# Patient Record
Sex: Female | Born: 1945
Health system: Southern US, Community
[De-identification: ages and names within clinical notes are randomized; demographics above are authoritative.]

## PROBLEM LIST (undated history)

## (undated) DIAGNOSIS — Z973 Presence of spectacles and contact lenses: Secondary | ICD-10-CM

## (undated) DIAGNOSIS — K449 Diaphragmatic hernia without obstruction or gangrene: Secondary | ICD-10-CM

## (undated) DIAGNOSIS — C679 Malignant neoplasm of bladder, unspecified: Secondary | ICD-10-CM

## (undated) DIAGNOSIS — Z860100 Personal history of colon polyps, unspecified: Secondary | ICD-10-CM

## (undated) DIAGNOSIS — M8580 Other specified disorders of bone density and structure, unspecified site: Secondary | ICD-10-CM

## (undated) HISTORY — DX: Other specified disorders of bone density and structure, unspecified site: M85.80

## (undated) HISTORY — PX: TRANSURETHRAL RESECTION OF BLADDER TUMOR WITH MITOMYCIN-C: SHX6459

## (undated) HISTORY — PX: COLONOSCOPY: SHX174

## (undated) HISTORY — PX: UPPER GASTROINTESTINAL ENDOSCOPY: SHX188

---

## 1998-08-07 ENCOUNTER — Ambulatory Visit (HOSPITAL_COMMUNITY): Admission: RE | Admit: 1998-08-07 | Discharge: 1998-08-07 | Payer: Self-pay | Admitting: Urology

## 1998-10-02 ENCOUNTER — Encounter: Payer: Self-pay | Admitting: Specialist

## 1998-10-02 ENCOUNTER — Ambulatory Visit (HOSPITAL_COMMUNITY): Admission: RE | Admit: 1998-10-02 | Discharge: 1998-10-02 | Payer: Self-pay | Admitting: Specialist

## 1998-11-13 ENCOUNTER — Ambulatory Visit (HOSPITAL_COMMUNITY): Admission: RE | Admit: 1998-11-13 | Discharge: 1998-11-13 | Payer: Self-pay | Admitting: Urology

## 1998-11-13 ENCOUNTER — Encounter (INDEPENDENT_AMBULATORY_CARE_PROVIDER_SITE_OTHER): Payer: Self-pay | Admitting: Specialist

## 1999-05-07 ENCOUNTER — Ambulatory Visit (HOSPITAL_COMMUNITY): Admission: RE | Admit: 1999-05-07 | Discharge: 1999-05-07 | Payer: Self-pay | Admitting: Urology

## 1999-05-07 ENCOUNTER — Encounter (INDEPENDENT_AMBULATORY_CARE_PROVIDER_SITE_OTHER): Payer: Self-pay | Admitting: Specialist

## 1999-07-01 ENCOUNTER — Encounter: Admission: RE | Admit: 1999-07-01 | Discharge: 1999-07-01 | Payer: Self-pay | Admitting: Obstetrics and Gynecology

## 1999-07-01 ENCOUNTER — Encounter: Payer: Self-pay | Admitting: Obstetrics and Gynecology

## 1999-11-30 ENCOUNTER — Ambulatory Visit (HOSPITAL_COMMUNITY): Admission: RE | Admit: 1999-11-30 | Discharge: 1999-11-30 | Payer: Self-pay | Admitting: Urology

## 1999-11-30 ENCOUNTER — Encounter (INDEPENDENT_AMBULATORY_CARE_PROVIDER_SITE_OTHER): Payer: Self-pay | Admitting: Specialist

## 2000-04-04 ENCOUNTER — Ambulatory Visit (HOSPITAL_COMMUNITY): Admission: RE | Admit: 2000-04-04 | Discharge: 2000-04-04 | Payer: Self-pay | Admitting: Urology

## 2000-04-04 ENCOUNTER — Encounter (INDEPENDENT_AMBULATORY_CARE_PROVIDER_SITE_OTHER): Payer: Self-pay

## 2000-07-03 ENCOUNTER — Encounter: Admission: RE | Admit: 2000-07-03 | Discharge: 2000-07-03 | Payer: Self-pay | Admitting: Obstetrics and Gynecology

## 2000-07-03 ENCOUNTER — Encounter: Payer: Self-pay | Admitting: Obstetrics and Gynecology

## 2000-09-14 ENCOUNTER — Other Ambulatory Visit: Admission: RE | Admit: 2000-09-14 | Discharge: 2000-09-14 | Payer: Self-pay | Admitting: Internal Medicine

## 2000-09-14 ENCOUNTER — Encounter (INDEPENDENT_AMBULATORY_CARE_PROVIDER_SITE_OTHER): Payer: Self-pay | Admitting: Specialist

## 2000-10-10 ENCOUNTER — Ambulatory Visit (HOSPITAL_COMMUNITY): Admission: RE | Admit: 2000-10-10 | Discharge: 2000-10-10 | Payer: Self-pay | Admitting: Urology

## 2000-10-10 ENCOUNTER — Encounter (INDEPENDENT_AMBULATORY_CARE_PROVIDER_SITE_OTHER): Payer: Self-pay

## 2001-07-05 ENCOUNTER — Encounter: Admission: RE | Admit: 2001-07-05 | Discharge: 2001-07-05 | Payer: Self-pay | Admitting: Obstetrics and Gynecology

## 2001-07-05 ENCOUNTER — Encounter: Payer: Self-pay | Admitting: Obstetrics and Gynecology

## 2001-07-20 ENCOUNTER — Encounter (INDEPENDENT_AMBULATORY_CARE_PROVIDER_SITE_OTHER): Payer: Self-pay | Admitting: *Deleted

## 2001-07-20 ENCOUNTER — Ambulatory Visit (HOSPITAL_BASED_OUTPATIENT_CLINIC_OR_DEPARTMENT_OTHER): Admission: RE | Admit: 2001-07-20 | Discharge: 2001-07-20 | Payer: Self-pay | Admitting: Urology

## 2002-02-16 ENCOUNTER — Observation Stay (HOSPITAL_COMMUNITY): Admission: EM | Admit: 2002-02-16 | Discharge: 2002-02-17 | Payer: Self-pay

## 2002-07-09 ENCOUNTER — Encounter: Payer: Self-pay | Admitting: Obstetrics and Gynecology

## 2002-07-09 ENCOUNTER — Encounter: Admission: RE | Admit: 2002-07-09 | Discharge: 2002-07-09 | Payer: Self-pay | Admitting: Obstetrics and Gynecology

## 2003-07-08 ENCOUNTER — Encounter (INDEPENDENT_AMBULATORY_CARE_PROVIDER_SITE_OTHER): Payer: Self-pay | Admitting: Specialist

## 2003-07-08 ENCOUNTER — Ambulatory Visit (HOSPITAL_BASED_OUTPATIENT_CLINIC_OR_DEPARTMENT_OTHER): Admission: RE | Admit: 2003-07-08 | Discharge: 2003-07-08 | Payer: Self-pay | Admitting: Urology

## 2003-07-08 ENCOUNTER — Ambulatory Visit (HOSPITAL_COMMUNITY): Admission: RE | Admit: 2003-07-08 | Discharge: 2003-07-08 | Payer: Self-pay | Admitting: Urology

## 2003-07-15 ENCOUNTER — Encounter: Admission: RE | Admit: 2003-07-15 | Discharge: 2003-07-15 | Payer: Self-pay | Admitting: Obstetrics and Gynecology

## 2004-04-20 ENCOUNTER — Ambulatory Visit (HOSPITAL_COMMUNITY): Admission: RE | Admit: 2004-04-20 | Discharge: 2004-04-20 | Payer: Self-pay | Admitting: Urology

## 2004-04-20 ENCOUNTER — Ambulatory Visit (HOSPITAL_BASED_OUTPATIENT_CLINIC_OR_DEPARTMENT_OTHER): Admission: RE | Admit: 2004-04-20 | Discharge: 2004-04-20 | Payer: Self-pay | Admitting: Urology

## 2004-04-20 ENCOUNTER — Encounter (INDEPENDENT_AMBULATORY_CARE_PROVIDER_SITE_OTHER): Payer: Self-pay | Admitting: *Deleted

## 2004-07-16 ENCOUNTER — Encounter: Admission: RE | Admit: 2004-07-16 | Discharge: 2004-07-16 | Payer: Self-pay | Admitting: Obstetrics and Gynecology

## 2005-08-30 ENCOUNTER — Encounter: Admission: RE | Admit: 2005-08-30 | Discharge: 2005-08-30 | Payer: Self-pay | Admitting: Obstetrics and Gynecology

## 2005-11-09 ENCOUNTER — Ambulatory Visit: Payer: Self-pay

## 2005-11-09 ENCOUNTER — Ambulatory Visit: Payer: Self-pay | Admitting: Cardiology

## 2005-11-10 ENCOUNTER — Ambulatory Visit: Payer: Self-pay | Admitting: Cardiology

## 2005-11-14 ENCOUNTER — Ambulatory Visit: Payer: Self-pay | Admitting: Cardiology

## 2006-09-01 ENCOUNTER — Encounter: Admission: RE | Admit: 2006-09-01 | Discharge: 2006-09-01 | Payer: Self-pay | Admitting: Obstetrics and Gynecology

## 2007-09-13 ENCOUNTER — Encounter: Admission: RE | Admit: 2007-09-13 | Discharge: 2007-09-13 | Payer: Self-pay | Admitting: Obstetrics and Gynecology

## 2008-09-15 ENCOUNTER — Encounter: Admission: RE | Admit: 2008-09-15 | Discharge: 2008-09-15 | Payer: Self-pay | Admitting: Obstetrics and Gynecology

## 2009-09-17 ENCOUNTER — Encounter: Admission: RE | Admit: 2009-09-17 | Discharge: 2009-09-17 | Payer: Self-pay | Admitting: Obstetrics and Gynecology

## 2010-01-01 ENCOUNTER — Encounter: Admission: RE | Admit: 2010-01-01 | Discharge: 2010-01-01 | Payer: Self-pay | Admitting: Internal Medicine

## 2010-07-23 NOTE — Discharge Summary (Signed)
   NAME:  Donna [name], Donna [name]                          ACCOUNT NO.:  [id number]   MEDICAL RECORD NO.:  [id number]                   PATIENT TYPE:  INP   LOCATION:  2023                                 FACILITY:  [facility]   PHYSICIAN:  Arturo Morton. Riley Kill, M.D. [facility]         DATE OF BIRTH:  10-Feb-1946   DATE OF ADMISSION:  02/16/2002  DATE OF DISCHARGE:  02/17/2002                           DISCHARGE SUMMARY - REFERRING   HISTORY:  The patient is a 65 year old, white, married female neighbor of  Dr. Charlies Constable with no prior history of prior cardiac problems, who was  admitted for evaluation of chest pressure radiating to the back, which began  about 30 minutes after lunch, but has been on and off since then.  She has  no history of hypertension, hypolipoproteinemia, diabetes, or family history  of coronary artery disease.  She does not smoke cigarettes.  She was  admitted for observation and was seen by Dr. Charlies Constable.  She was having  some PACs.  He began her on Toprol XL 50 mg q.d.  She was discharged home  the next morning after she ruled out and we plan to get a rest/stress  Cardiolite on Monday or Tuesday (i.e., in one or two days).   LABORATORY DATA:  Unremarkable.  Her BUN 16 with a creatinine of 1.0.  Potassium was 3.7.   FINAL DIAGNOSES:  1. Chest pain- myocardial infarction ruled out.  2. History of bladder cancer.   DISPOSITION:  As noted she was discharged home on Toprol XL 50 mg q.d. and  will get a rest/stress Cardiolite in the next 24 to 48 hours.     Dian Queen, P.A. [facility]                     Thomas D. Riley Kill, M.D. [facility]    BY/MEDQ  D:  02/17/2002  T:  02/17/2002  Job:  [id number]   cc:   Charlies Constable, M.D. [facility]  [address]  [place]  [place] [address]

## 2010-07-23 NOTE — Op Note (Signed)
[facility]  Patient:    Donna [name], Donna [name]                  MRN: [id number] Proc. Date: 10/10/00 Adm. Date:  [id number] Attending:  Ellwood Handler                           Operative Report  DATE OF BIRTH:  Jan 08, 1946  REFERRING PHYSICIAN:  Darius Bump, M.D. and S. Kyra Manges, M.D.  UROLOGIST:  Verl Dicker, M.D.  PREOPERATIVE DIAGNOSIS:  Bladder tumor.  POSTOPERATIVE DIAGNOSIS:  Bladder tumor.  PROCEDURE:  Transurethral resection of bladder tumor, small; intravesical installation of 40 mg of mitomycin C postoperatively.  ANESTHESIA:  General.  DRAINS:  20 French Foley.  SPECIMENS:  TUR chips posterior bladder wall.  DESCRIPTION OF PROCEDURE:  The patient was prepped and draped in the dorsal lithotomy position after institution of an adequate level of general anesthesia. A well lubricated 21 French panendoscope was gently inserted at the urethral meatus, normal urethra and sphincter, normal trigone and orifices. Papillary tumors identified along the posterior wall. No other suspicious urothelium identified. Representative samples were taken with cold cut biopsy forceps. Adjacent urothelium superficially resected with TUR loop, no evidence of bladder perforation. All specimen sent to pathology labeled as bladder tumor posterior bladder wall. Bleeding sites were lightly cauterized using the vapotrode element. The resectoscope sheath was withdrawn, the Foley catheter was inserted, left to straight drain, irrigated easily with clear saline. The 40 mg of mitomycin C was then instilled under gravity, left in place for approximately one hour. The Foley catheter was released. All tissue which had come into contact with mitomycin C was then collected in a biohazard bag after the bladder had been irrigated. The bladder was then left to straight drain with a leg bag and the patient was returned to recovery in satisfactory  condition. DD:  10/10/00 TD:  10/11/00 Job: [id number]

## 2010-07-23 NOTE — Procedures (Signed)
[facility]  Patient:    Donna [name], Donna [name]                  MRN: [id number] Proc. Date: 04/04/00 Adm. Date:  [id number] Attending:  Ellwood Handler CC:         Darius Bump, M.D.  Katherine Roan, M.D.   Procedure Report  DATE OF BIRTH:  May 17, 1945  LMD: 1. Feliciana Rossetti, M.D. 2. Darius Bump, M.D.  GYNECOLOGIST:  Katherine Roan, M.D.  PREOPERATIVE DIAGNOSIS:  Papillary noninvasive transitional cell carcinoma, transurethral resection of bladder tumor, June 2000, September 2000, March of 2001.  All lesions show grade 2 noninvasive transitional cell carcinoma.  CT scan of the abdomen and pelvis as well as chest x-ray done prior to initial resection showed no evidence of extravesical disease.  The patient did have two small papillary recurrences in September 2001.  Second opinion with Reinaldo Raddle at [facility] advised continued cystoscopy, periodic biopsy and TURBT.  At the end of discussion, he also mentioned the possibility of intravesical BCG.  The patient is inclined to proceed with that option.  Began BCG in midnovember, completed BCG about six weeks ago.  She returns today for follow-up cytologies and biopsies.  POSTOPERATIVE DIAGNOSIS:  Papillary noninvasive transitional cell carcinoma, transurethral resection of bladder tumor, June 2000, September 2000, March of 2001.  All lesions show grade 2 noninvasive transitional cell carcinoma.  CT scan of the abdomen and pelvis as well as chest x-ray done prior to initial resection showed no evidence of extravesical disease.  The patient did have two small papillary recurrences in September 2001.  Second opinion with Reinaldo Raddle at [facility] advised continued cystoscopy, periodic biopsy and TURBT.  At the end of discussion, he also mentioned the possibility of intravesical BCG.  The patient is inclined to proceed with that option.   Began BCG in midnovember, completed BCG about six weeks ago.  She returns today for follow-up cytologies and biopsies.  OPERATION PERFORMED:  Cystoscopy, collection of urine cytologies and biopsies.  SURGEON:  Verl Dicker, M.D.  ANESTHESIA:  General.  DESCRIPTION OF PROCEDURE:  The patient was prepped and draped in dorsal lithotomy position after institution of an adequate level of general anesthesia.  A well-lubricated 21 French panendoscope was gently inserted at the urethral meatus.  Normal urethra and sphincter.  Normal trigone and orifices.  Bladder showed small patches of erythematous mucosa along the posterior wall and left wall.  The urothelium was otherwise unremarkable. Bladder urine was collected for cytology.  Bladder washings were collected for cytology.  Cold cup biopsies were taken from right lateral wall, posterior wall and left wall taking care to include any suspicious urothelium. Bleeding sites were lightly fulgurated using the Bugbee electrode.  20 French Foley catheter was inserted at the end of the procedure and left to straight drain and the patient was returned to recovery in satisfactory condition. DD:  04/04/00 TD:  04/04/00 Job: [id number]

## 2010-07-23 NOTE — H&P (Signed)
NAME:  Donna [name], Donna [name]                          ACCOUNT NO.:  [id number]   MEDICAL RECORD NO.:  [id number]                   PATIENT TYPE:  INP   LOCATION:  2023                                 FACILITY:  [facility]   PHYSICIAN:  Hubbard Hartshorn, M.D. [facility]              DATE OF BIRTH:  1945-07-16   DATE OF ADMISSION:  02/16/2002  DATE OF DISCHARGE:  02/17/2002                                HISTORY & PHYSICAL   HISTORY OF PRESENT ILLNESS:  The patient is a 65 year old white female with  no significant past medical history.  She presents to the emergency room  with chest pressure in the anterior chest that later radiated to the back.  The pressure started for 30 minutes after eating lunch at 2 p.m. and has  been on and off for the past four hours.  It was an 8/10 in severity in the  emergency room, but has now subsided to 1-2/10.  She denies any other  symptoms.  No nausea, vomiting, diaphoresis, shortness of breath, dizziness,  syncope, or palpitations.  Initially the chest pressure was 2/10 in  severity.  She has never had any prior history of chest pain or pressure.  She is a very active woman.  She denies any history of cocaine, tobacco use,  hypertension, hypercholesterolemia, diabetes, or a family history of  coronary artery disease.   PAST MEDICAL HISTORY:  Significant for bladder cancer, which is noninvasive  and she is currently being treated.   MEDICATIONS:  1. Levlin, which is an oral contraceptive.  2. Lamisil for a fungus on the feet.   PRIMARY PHYSICIAN:  Darius Bump, M.D.   FAMILY HISTORY:  Her mother had a pacemaker put in at age 28.   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  She is married with three children.  She denies any tobacco, but does drink  socially.   REVIEW OF SYSTEMS:  Negative for CNS.  Negative for GI.  Negative for GU.  Negative for musculoskeletal.  Negative for neurologic.  Negative for  psychiatric.   Negative for dermatologic other than fundus on the food.   PHYSICAL EXAMINATION:  VITAL SIGNS:  Her blood pressure is 147/76, heart  rate 80, temperature 96.5, respiratory rate 18, and saturating 99% on room  air.  Her blood pressure in her right arm was 127/73 and in her left arm was  122/67.  GENERAL APPEARANCE:  The patient is lying comfortably in bed in no acute  distress.  HEENT:  [place]/AT.  Pupils equal and reactive to light.  NECK:  There is no JVD.  No hepatojugular reflux.  No carotid bruits with 2+  carotid upstroke.  LUNGS:  Clear to auscultation bilaterally with no focal consolidation.  CARDIAC:  Her PMI is nondisplaced with a normal S1 and S2.  Regular rate and  rhythm with a  few ectopic beats.  She has a 1-2/6 systolic ejection murmur  at the apex radiating into the upper left sternal border with no appreciable  S3 or S4.  ABDOMEN:  Soft, nontender, and nondistended.  No hepatosplenomegaly.  No  masses.  Normoactive bowel sounds.  EXTREMITIES:  She has no cyanosis, clubbing, or edema with 2+ distal pulses.  RECTAL:  Guaiac exam was negative with brown stool and good sphincter tone.   LABORATORY DATA:  The EKG was normal sinus rhythm and no ischemic changes  with positive premature atrial contractions.  Her labs are currently  pending.  Her chest x-ray is currently pending.   ASSESSMENT AND PLAN:  A 65 year old female with no significant past medical  history other than bladder cancer.  She presents with four hours of  intermittent chest discomfort with no other associated symptoms and fairly  benign physical exam and PVCs on the EKG, but no ischemic changes.  There is  a low to intermediate risk sufficient for cardiac etiology.  Will check a  chest x-ray.  The plan will be to admit and check serial enzymes overnight.  If her enzymes are negative, then will plan for an exercise Cardiolite.  If  they are positive, then will plan for cardiac catheterization.  Also check a   lipid profile and BNP.  By her history, she is unlikely to have aortic  dissection or pulmonary embolism.  However, will check a D-dimer and  evaluate her chest x-ray.  If any of these are abnormal, will consider  getting a CT to rule out dissection versus pulmonary embolism.  Overnight  will check her with heparin, aspirin, and beta blocker.  Will plan to do a  stress test on Monday if she has negative enzymes.  If the above tests are  all negative, would consider a GI etiology and a trial of treatment with  Zantac.                                               Hubbard Hartshorn, M.D. [facility]    JH/MEDQ  D:  02/16/2002  T:  02/17/2002  Job:  [id number]

## 2010-07-23 NOTE — Op Note (Signed)
[facility]  Patient:    Donna [name], Donna [name]                  MRN: [id number] Proc. Date: 11/30/99 Adm. Date:  [id number] Attending:  Ellwood Handler CC:         Katherine Roan, M.D.  Darius Bump, M.D.   Operative Report  DATE OF BIRTH:  [birth year]  INDICATIONS:  History of papillary non-invasive TCCA, TURBT, June 2000, September 2000, March 2001.  All lesions have shown consistent pattern of grade II papillary TCCA with no evidence of invasion or musuclar involvement. CT scan of the abdomen and pelvis as well as chest x-ray done prior to the initial resection showed no evidence of extravesical disease.  PREOPERATIVE DIAGNOSIS:  Recurrent papillary bladder tumor.  POSTOPERATIVE DIAGNOSIS:  Recurrent papillary bladder tumor.  OPERATION: 1. Cystoscopy. 2. Bladder biopsy. 3. Transurethral resection bladder tumor. 4. Transurethral resection VaporTrode.  SURGEON:  Verl Dicker, M.D.  ANESTHESIA:  General.  DRAINS:  No. 20 French Foley.  SPECIMENS:  Bladder tumor.  DESCRIPTION OF PROCEDURE:  The patient was prepped and draped in the dorsal lithotomy position after institution of adequate level of general anesthesia. Bladder was carefully inspected with the panendoscope using the 30 and 70 degree lenses.  Trigone and orifices showed no abnormality.  Stellate scar behind the left orifice indicated the area of the original TURBT.  There was no evidence of reoccurrence here.  Several small papillary lesions were identified along the posterior bladder wall.  The largest of the lesions resected with TUR LEEP.  Smaller lesions resected with the biopsy forceps. Once all suspicious urothelium or papillary lesions had either been resected or biopsied, VaporTrode element was introduced, and care was taken to ensure adequate hemostasis.  There was no evidence of bladder perforation. Cystos;cope was then withdrawn.  Foley  catheter was inserted, and then bladder was patiently irrigated 500 to 750 cc of sterile water over a period of 10 to 15 minutes.  Foley was left to straight drain, and the patient was returned to recovery in satisfactory condition. DD:  11/30/99 TD:  11/30/99 Job: [id number]

## 2010-07-23 NOTE — Op Note (Signed)
NAME:  Donna [name], Donna [name]                          ACCOUNT NO.:  [id number]   MEDICAL RECORD NO.:  [id number]                   PATIENT TYPE:  AMB   LOCATION:  [facility]                                 FACILITY:  [facility]   PHYSICIAN:  Boston Service, M.D.             DATE OF BIRTH:  [birth year]   DATE OF PROCEDURE:  07/08/2003  DATE OF DISCHARGE:                                 OPERATIVE REPORT   INTERNAL MEDICINE:  Darius Bump, M.D.   GYNECOLOGY:  Katherine Roan, M.D.   UROLOGY:  Boston Service, M.D.   PREOPERATIVE DIAGNOSES:  Bladder tumor.   POSTOPERATIVE DIAGNOSES:  Bladder tumor.   PROCEDURE:  Cystoscopy, TURBT, small with post resection intravesical  mitomycin C 40 mg of mitomycin in 40 mL sterile water.   ANESTHESIA:  General.   DRAINS:  None.   COMPLICATIONS:  None.   DESCRIPTION OF PROCEDURE:  The patient was prepped and draped in the dorsal  lithotomy position after institution of an adequate level of general  anesthesia.  The bladder was carefully inspected with the 12 degree and 70  degree lenses.  Papillary tumors around the left ureteral orifices were  easily identified, no other suspicious urothelium was seen.  Suspicious  lesions were then resected between the 5 o'clock and 6 o'clock position  taking care to avoid injury to the distal left ureter.  Once all resectable  lesions had been removed, hemostasis was obtained with the VaporTrode  element, continued clear efflux in the left orifice was demonstrated, no  bleeding sites remained, no evidence of bladder perforation.  An 7 French  catheter was inserted and left to straight drain, the bladder was  evacuated and then 40 mL of a solution with 40 mg of mitomycin C was  instilled in the bladder and allowed to remain in place for one hour.  Mitomycin C was then drained from the bladder and disposed of properly. The  Foley catheter was removed, the patient was given a B&O suppository and  returned to  recovery in satisfactory condition.                                               Boston Service, M.D.    RH/MEDQ  D:  07/08/2003  T:  07/08/2003  Job:  [id number]   cc:   Darius Bump, M.D.  [phone] [address]  [place] [address]  Fax: [phone]   S. Kyra Manges, M.D.  [phone] [address]  [place]  [place] [address]  Fax: [phone]

## 2010-07-23 NOTE — Op Note (Signed)
[name], [name]                ACCOUNT NO.:  [id number]   MEDICAL RECORD NO.:  [id number]          PATIENT TYPE:  AMB   LOCATION:  [facility]                         FACILITY[facility]   PHYSICIAN:  Boston Service, M.D.DATE OF BIRTH:  Jul 29, 1945   DATE OF PROCEDURE:  04/20/2004  DATE OF DISCHARGE:                                 OPERATIVE REPORT   INTERNAL MEDICINE:  Darius Bump, M.D.   GYNECOLOGY:  Katherine Roan, M.D.   NOTE:  Reference is made to previous office notes from April 12, 2004;  June 30, 2003; November 25, 2002; Jul 23, 2002; and Jul 08, 2003.   PREOPERATIVE DIAGNOSIS:  Recurrent noninvasive papillary TCCA.   POSTOPERATIVE DIAGNOSIS:  Recurrent noninvasive papillary TCCA.   PROCEDURE:  Cystoscopy, transurethral resection of bladder tumor (small).   ANESTHESIA:  General, intravesical mitomycin-C 40 mg and 40 cc of sterile  water.   SPECIMENS:  Tumor.   DRAINS:  A 20-French Foley.   DESCRIPTION OF PROCEDURE:  The patient was prepped and draped in the dorsal  lithotomy position after institution of adequate level of general  anesthesia.  A well-lubricated 21-French pan endoscope was gently inserted  at the urethral meatus.  Normal urethra and sphincter.  Normal trigone and  orifices.  Papillary tumor, left wall.  Remainder of the urothelium was  unremarkable.  Endoscopic photographs were taken.  Tumor was then resected  between the 2 o'clock and 3 o'clock position taking care to include a small  amount of underlying muscular tissue but to avoid bladder perforation.  Once  all resectable tissue had been removed, VaporTrode element was used to  establish adequate hemostasis.  Resectoscope sheath was then withdrawn.  A  20-French Foley was inserted.  Bladder irrigated easily with sterile water  over a period of about 10 or 15 minutes, and 40 mg mitomycin and 40 cc of  sterile water were then instilled in the bladder at the patient's request.  Medication was  allowed to remain in the bladder for  an hour while she remained under anesthesia.  It was drained from the  bladder and disposed of properly.  Catheter was then irrigated again with  sterile water and the patient was sent to recovery with Foley catheter to  straight drain, having been given a B&O suppository.      RH/MEDQ  D:  04/20/2004  T:  04/20/2004  Job:  [id number]   cc:   S. Kyra Manges, M.D.  [phone] [address]  [place]  [place] [address]  Fax: [phone]   Darius Bump, M.D.  [phone] [address]  [place] [address]  Fax: [phone]

## 2010-07-23 NOTE — Assessment & Plan Note (Signed)
[facility]                              CARDIOLOGY OFFICE NOTE   NAME:Hessel, Donna [name]                       MRN:          [id number]  DATE:11/09/2005                            DOB:          1945-06-21    PRIMARY CARE PHYSICIAN:  Tinnie Gens A. Tawanna Cooler, M.D.   CLINICAL HISTORY:  Donna [name] called me last night at home with symptoms of  some mild chest tightness and nervousness and fluttering in her chest.  We  arranged for her to come in for an office visit today.  She says these  symptoms began about a week ago.  She initially had a GI upset with some  nausea and I believe diarrhea.  She also said she drank a large amount of  tea with caffeine in it, and then after this she noticed this nervous-like  feeling in her chest and some irregularity and feeling her heart beating  fast.  She had some episodic shortness of breath and mild tightness with  this.  These symptoms have persisted over the past week intermittently.  She  has had no exertional symptoms, and she has worked out with her trainer both  with aerobic exercises and weights without any symptoms.   I had seen Donna [name] back in 2004 when she was hospitalized with chest pain and  palpitations.  At that time, we did Cardiolite scan and echocardiogram, both  of which were normal, except for some mild thickening of the mitral valve.  We documented only APC's, although, when I listened to her, I thought she  might have been in atrial fibrillation, but we never documented that.  We  put her on atrial fibrillation for a short term, and then her symptoms  improved.   PAST MEDICAL HISTORY:  Negative for hypertension and diabetes.  She had a  lipid profile done in the past, which was quite good.   CURRENT MEDICATIONS:  1. Vitamins.  2. Calcium.   FAMILY HISTORY:  Negative for cardiac disease, except for her mother who had  a pacemaker in her 67s.   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  She is married and has 3 children.  She does not smoke.   REVIEW OF SYSTEMS:  Positive for recent GI symptoms.   PHYSICAL EXAMINATION:  VITAL SIGNS:  Blood pressure was 118/72, pulse 80 and  regular with some extra beats.  NECK:  There was no venous distention.  The carotid pulses were full without  bruits.  CHEST:  Clear.  CARDIAC:  The cardiac rhythm was regular.  I could hear no murmurs, gallops  or clicks.  The heart sounds were normal.  ABDOMEN:  Soft with normal bowel sounds.  There was no hepatosplenomegaly.  EXTREMITIES:  The peripheral pulses were full.  There was no peripheral  edema.  MUSCULOSKELETAL:  No deformities.  SKIN:  Warm and dry.  NEUROLOGIC:  No focal neurologic signs.   An ECG was normal, except for APC's with a _________ conduction versus  PVC's.   IMPRESSION:  Palpitations  with documented atrial premature contractions and  premature ventricular contractions.  No evidence of organic heart disease by  prior studies and by current exam.   RECOMMENDATIONS:  I think [name] symptoms are related to cardiac  arrhythmia.  It appears that this may have been triggered by recent GI  illness and excess caffeine, even though these instigating factors have  resolved.  There does not appear to be any increased stress.  Will plan to  evaluate her further with a 24-hour Holter monitor to further document the  arrhythmia and be sure there is no atrial fibrillation.  Following the  monitor, if her symptoms persist, will give her a trial of Toprol 25 a day.  Will also get some laboratory work including a CBC, a BNP, lipid and liver  and magnesium and TSH.  I will be in touch after we have the results of the  monitor.                               Bruce Elvera Lennox Juanda Chance, MD, [facility]    BRB/MedQ  DD:  11/09/2005  DT:  11/10/2005  Job #:  [id number]

## 2010-07-23 NOTE — Op Note (Signed)
[facility]  Patient:    Donna [name], Donna [name] Visit Number: [id number] MRN: [id number]          Service Type: NES Location: [facility] Attending Physician:  Ellwood Handler Dictated by:   Verl Dicker, M.D. Proc. Date: 07/20/01 Admit Date:  07/20/2001   CC:         Darius Bump, M.D.   Operative Report  DATE OF BIRTH:  1945/10/14  LMD:  Darius Bump, M.D.  UROLOGIST:  Verl Dicker, M.D.  PREOPERATIVE DIAGNOSES:  Noninvasive papillary transitional cell carcinoma, transurethral resection of bladder tumor May of 2000, September of 2000, March 2001, September of 2001 and August of 2002. Identical pathology report grade 1-2 noninvasive TCCA, CT scan of the abdomen and pelvis as well as chest x-ray done prior to initial resection showed no evidence of extravesical disease. Recent cystoscopy showed small papillary reoccurrence along the posterior bladder wall.  POSTOPERATIVE DIAGNOSES:  Same.  PROCEDURE:  TURBT (small with 40 mg of mitomycin C instill intravesically for 60 minutes).  DRAINS:  20 [place] Foley.  SPECIMENS:  TUR chips.  DESCRIPTION OF PROCEDURE:  The patient was prepped and draped in the dorsal lithotomy position after institution of an adequate level of general anesthesia (LMA). Careful inspection of the bladder with 30 and 70 degree lenses showed single papillary reoccurrence along the junction of the posterior bladder wall and dome. The lesion was resected along with some adjacent normal urothelium, no evidence of bladder perforation. Biopsy site and adjacent urothelium cauterized using the vapotrode element. Once all bleeding sites had been cauterized, the resectoscope sheath was withdrawn, a 20 [place] Foley was inserted, 40 mg of mitomycin C was instilled in the bladder, left in place for 60 minutes and then drained. It was disposed of properly and the patient was returned to recovery with Foley  catheter to straight drain and a leg bag. Dictated by:   Verl Dicker, M.D. Attending Physician:  Ellwood Handler DD:  07/20/01 TD:  07/22/01 Job: [phone] [id number]

## 2010-08-23 ENCOUNTER — Other Ambulatory Visit: Payer: Self-pay | Admitting: Internal Medicine

## 2010-08-23 DIAGNOSIS — Z1231 Encounter for screening mammogram for malignant neoplasm of breast: Secondary | ICD-10-CM

## 2010-09-13 ENCOUNTER — Encounter: Payer: Self-pay | Admitting: Internal Medicine

## 2010-09-22 ENCOUNTER — Ambulatory Visit
Admission: RE | Admit: 2010-09-22 | Discharge: 2010-09-22 | Disposition: A | Discharge status: Home / Self Care | Payer: BC Managed Care – PPO | Source: Ambulatory Visit | Attending: Internal Medicine | Admitting: Internal Medicine

## 2010-09-22 DIAGNOSIS — Z1231 Encounter for screening mammogram for malignant neoplasm of breast: Secondary | ICD-10-CM

## 2010-10-07 ENCOUNTER — Telehealth: Payer: Self-pay | Admitting: *Deleted

## 2010-10-07 ENCOUNTER — Ambulatory Visit (AMBULATORY_SURGERY_CENTER): Payer: BC Managed Care – PPO | Admitting: *Deleted

## 2010-10-07 ENCOUNTER — Encounter: Payer: Self-pay | Admitting: Internal Medicine

## 2010-10-07 DIAGNOSIS — Z1211 Encounter for screening for malignant neoplasm of colon: Secondary | ICD-10-CM

## 2010-10-07 DIAGNOSIS — Z800 Family history of malignant neoplasm of digestive organs: Secondary | ICD-10-CM

## 2010-10-07 MED ORDER — PEG-KCL-NACL-NASULF-NA ASC-C 100 G PO SOLR: 1.0000 | Freq: Once | ORAL | Status: DC

## 2010-10-07 NOTE — Telephone Encounter (Signed)
OK to do EGD/colon

## 2010-10-07 NOTE — Telephone Encounter (Signed)
Dr. Juanda Chance, This patient was in this morning for her previsit.  She is here for a screening colonoscopy- her colonoscopy 10 years ago was normal.  She also had an EGD at that time due to her father dying of stomach CA.  She stated she thought that both procedures were going to be done at the same time.  You had an opening for an ECL on 10-21-10 (her original date for the colon), so I took it.  If that is not ok, please let me know and I will change it back.    Thank you, Baxter Hire

## 2010-10-21 ENCOUNTER — Other Ambulatory Visit: Payer: Self-pay | Admitting: Internal Medicine

## 2010-10-21 ENCOUNTER — Encounter: Payer: Self-pay | Admitting: Internal Medicine

## 2010-10-21 ENCOUNTER — Ambulatory Visit (AMBULATORY_SURGERY_CENTER): Payer: BC Managed Care – PPO | Admitting: Internal Medicine

## 2010-10-21 DIAGNOSIS — Z1211 Encounter for screening for malignant neoplasm of colon: Secondary | ICD-10-CM

## 2010-10-21 DIAGNOSIS — K222 Esophageal obstruction: Secondary | ICD-10-CM

## 2010-10-21 DIAGNOSIS — D126 Benign neoplasm of colon, unspecified: Secondary | ICD-10-CM

## 2010-10-21 DIAGNOSIS — D1330 Benign neoplasm of unspecified part of small intestine: Secondary | ICD-10-CM

## 2010-10-21 DIAGNOSIS — Z800 Family history of malignant neoplasm of digestive organs: Secondary | ICD-10-CM

## 2010-10-21 MED ORDER — OMEPRAZOLE 20 MG PO CPDR: 20.0000 mg | DELAYED_RELEASE_CAPSULE | Freq: Every day | ORAL | Status: DC

## 2010-10-21 MED ORDER — SODIUM CHLORIDE 0.9 % IV SOLN: 500.0000 mL | INTRAVENOUS | Status: DC

## 2010-10-21 NOTE — Patient Instructions (Signed)
Discharge instructions given to patient/caregiver and handouts. We will call in the morning for a follow-up. Handouts on stricture and dilation diet given.

## 2010-10-22 ENCOUNTER — Telehealth: Payer: Self-pay | Admitting: *Deleted

## 2010-10-22 NOTE — Telephone Encounter (Signed)
Message left on voice mail with ID. 

## 2010-10-27 ENCOUNTER — Encounter: Payer: Self-pay | Admitting: Internal Medicine

## 2011-02-21 ENCOUNTER — Other Ambulatory Visit: Payer: Self-pay | Admitting: Dermatology

## 2011-06-24 ENCOUNTER — Other Ambulatory Visit: Payer: Self-pay | Admitting: Urology

## 2011-06-24 MED ORDER — MITOMYCIN CHEMO FOR BLADDER INSTILLATION 40 MG: 40.0000 mg | Freq: Once | INTRAVENOUS | Status: AC

## 2011-06-27 ENCOUNTER — Encounter (HOSPITAL_COMMUNITY): Payer: Self-pay | Admitting: Pharmacy Technician

## 2011-06-28 ENCOUNTER — Encounter (HOSPITAL_COMMUNITY)
Admission: RE | Admit: 2011-06-28 | Discharge: 2011-06-28 | Disposition: A | Discharge status: Home / Self Care | Payer: BC Managed Care – PPO | Source: Ambulatory Visit | Attending: Urology | Admitting: Urology

## 2011-06-28 ENCOUNTER — Encounter (HOSPITAL_COMMUNITY): Payer: Self-pay

## 2011-06-28 LAB — BASIC METABOLIC PANEL
CO2: 26 mEq/L (ref 19–32)
Chloride: 103 mEq/L (ref 96–112)
GFR calc Af Amer: 85 mL/min — ABNORMAL LOW (ref >90)
Sodium: 139 mEq/L (ref 135–145)

## 2011-06-28 LAB — CBC
MCH: 30.3 pg (ref 26.0–34.0)
MCHC: 33.7 g/dL (ref 30.0–36.0)
Platelets: 232 10*3/uL (ref 150–400)

## 2011-06-28 LAB — SURGICAL PCR SCREEN: MRSA, PCR: NEGATIVE

## 2011-06-28 NOTE — Patient Instructions (Signed)
20 Donna [name]  06/28/2011   Your procedure is scheduled on:  Monday 07/04/2011 at 330pm  Report to [facility] at 100 pm  Call this number if you have problems the morning of surgery: [phone]   Remember:   Do not eat food:after midnight  May have clear liquids:up to 6 Hours before arrival.-may have clear liquids from midnight up until 0930am day of surgery.  Clear liquids include soda, tea, black coffee, apple or grape juice, broth.  Take these medicines the morning of surgery with A SIP OF WATER: None   Do not wear jewelry, make-up or nail polish.  Do not wear lotions, powders, or perfumes.  Do not shave 48 hours prior to surgery(women only-shaving legs)  Do not bring valuables to the hospital.  Contacts, dentures or bridgework may not be worn into surgery.  Leave suitcase in the car. After surgery it may be brought to your room.  For patients admitted to the hospital, checkout time is 11:00 AM the day of discharge.   Patients discharged the day of surgery will not be allowed to drive home.  Name and phone number of your driver:Robin [phone]  Special Instructions: CHG Shower Use Special Wash: 1/2 bottle night before surgery and 1/2 bottle morning of surgery.   Please read over the following fact sheets that you were given: MRSA Information,Incentive Spirometry, Sleep apnea sheet

## 2011-07-01 NOTE — H&P (Signed)
History of Present Illness  [name] is a 66 year old previously cared for by Dr. Wanda [name] with the following urologic history:  1) Urothelial carcinoma of the bladder: She has a long history of low-grade, Ta urothelial carcinoma of the bladder.  May 2000: TURBT Sep 2000: TURBT Mar 2001: TURBT Sep 2001: TURBT Sep-Oct 2001: BCG (6 week induction) Aug 2002: TURBT May 2003: TURBT May 2005: TURBT Feb 2006: TURBT [facility] post-op)  Interval history:  She follows up for surveillance cystoscopy for history of urothelial carcinoma the bladder. She denies any recent hematuria or change in her voiding habits.   Past Medical History Problems  1. History of  Heart Rhythm Irregular  Current Meds 1. Daily Multivitamin TABS; Therapy: (Recorded:14Apr2008) to  Allergies Medication  1. No Known Drug Allergies  Family History Denied  1. Family history of  Bladder Cancer  Social History Problems  1. Alcohol Use 2 on weekends 2. Family history of  Death In The Family Father age 67 stomach cancer 3. Family history of  Death In The Family Mother age 43 heart 4. Former Smoker V15.82 1ppdx30 quit in 1981 5. Marital History - Currently Married 6. Occupation: Print production planner  Review of Systems  Genitourinary: no hematuria.    Vitals Vital Signs [Data Includes: Last 1 Day]  18Apr2013 04:08PM  BMI Calculated: 24.25 BSA Calculated: 1.86 Height: 5 ft 8 in Weight: 160 lb  Blood Pressure: 150 / 85 Heart Rate: 56  Physical Exam Constitutional: Well nourished and well developed . No acute distress.  ENT:. The ears and nose are normal in appearance.  Neck: The appearance of the neck is normal and no neck mass is present.  Pulmonary: No respiratory distress and normal respiratory rhythm and effort.  Cardiovascular:. No peripheral edema.  Genitourinary: The urethra is normal in appearance. There is no urethral mass.  Skin: Normal skin turgor, no visible rash and no visible skin  lesions.  Neuro/Psych:. Mood and affect are appropriate.    Results/Data Urine [Data Includes: Last 1 Day]   18Apr2013  COLOR STRAW   APPEARANCE CLEAR   SPECIFIC GRAVITY <1.005   pH 6.0   GLUCOSE NEG mg/dL  BILIRUBIN NEG   KETONE NEG mg/dL  BLOOD NEG   PROTEIN NEG mg/dL  UROBILINOGEN 0.2 mg/dL  NITRITE NEG   LEUKOCYTE ESTERASE NEG    Procedure  Procedure: Cystoscopy  Indication: History of Urothelial Carcinoma.  Informed Consent: Risks, benefits, and potential adverse events were discussed and informed consent was obtained from the patient.  Prep: The patient was prepped with betadine.  Antibiotic prophylaxis: Ciprofloxacin.  Procedure Note:  Urethral meatus:. No abnormalities.  Anterior urethra: No abnormalities.  Bladder: Visulization was clear. The ureteral orifices were in the normal anatomic position bilaterally and had clear efflux of urine. A solitary tumor was visualized in the bladder. A papillary tumor was seen in the bladder measuring approximately 1 cm in size. This tumor was located on the left side, near the trigone of the bladder. A saline bladder washing was obtained and sent for cytologic analysis. The patient tolerated the procedure well.  Complications: None.    Assessment Assessed  1. Bladder Cancer 188.9  Plan Health Maintenance (V70.0)  1. UA With REFLEX  Done: 18Apr2013 04:00PM  Discussion/Summary 1. Urothelial carcinoma of the bladder: She does appear to have a recurrent papillary bladder tumor. I recommended proceeding with cystoscopy, bilateral retrograde pyelography, and transurethral resection of her bladder tumor. Her tumor does appear to most likely  be a low-grade tumor and I recommended proceeding with postoperative mitomycin C. administered intravesically. We reviewed the potential risks and complications associated with this procedure as well as alternative options. She is agreeable to proceeding gives her informed consent. This will be  scheduled for the near future.  Cc: Dr. Guerry [name]     Verified Results URINE CYTOLOGY1 18Apr2013 04:42PM1 Donna [name]  SPECIMEN TYPE: OTHER   Test Name Result Flag Reference  FINAL DIAGNOSIS:1  A1   - ABNORMAL FINDINGS. - ATYPICAL UROTHELIAL CELLS ARE PRESENT.  SOURCE:1 Bladder Washing1    1 VIAL RCVD 120CC OF CLEAR BLW 1 SLIDE PREPARED [id number] LW  Relevant Clinical Info1 BLADDER CANCER1    PATHOLOGIST:1     Reviewed by Donna [name]. Fields, MD, FCAP Proofreader on File)  1 Container Submitted  CYTOTECHNOLOGIST:1     Donna [name], BS,MS CT(ASCP)     1. Amended By: Donna [name]; 06/24/2011 4:32 PMEST  Signatures Electronically signed by : Donna [name], M.D.; Jun 24 2011  4:32PM

## 2011-07-04 ENCOUNTER — Encounter (HOSPITAL_COMMUNITY)
Admission: RE | Disposition: A | Discharge status: Home / Self Care | Payer: Self-pay | Source: Ambulatory Visit | Attending: Urology

## 2011-07-04 ENCOUNTER — Encounter (HOSPITAL_COMMUNITY): Payer: Self-pay | Admitting: Anesthesiology

## 2011-07-04 ENCOUNTER — Ambulatory Visit (HOSPITAL_COMMUNITY)
Admission: RE | Admit: 2011-07-04 | Discharge: 2011-07-04 | Disposition: A | Discharge status: Home / Self Care | Payer: BC Managed Care – PPO | Source: Ambulatory Visit | Attending: Urology | Admitting: Urology

## 2011-07-04 ENCOUNTER — Ambulatory Visit (HOSPITAL_COMMUNITY): Payer: BC Managed Care – PPO | Admitting: Anesthesiology

## 2011-07-04 ENCOUNTER — Encounter (HOSPITAL_COMMUNITY): Payer: Self-pay | Admitting: *Deleted

## 2011-07-04 DIAGNOSIS — Z01812 Encounter for preprocedural laboratory examination: Secondary | ICD-10-CM | POA: Insufficient documentation

## 2011-07-04 DIAGNOSIS — C672 Malignant neoplasm of lateral wall of bladder: Secondary | ICD-10-CM | POA: Insufficient documentation

## 2011-07-04 DIAGNOSIS — Z87891 Personal history of nicotine dependence: Secondary | ICD-10-CM | POA: Insufficient documentation

## 2011-07-04 DIAGNOSIS — C679 Malignant neoplasm of bladder, unspecified: Secondary | ICD-10-CM

## 2011-07-04 HISTORY — PX: TRANSURETHRAL RESECTION OF BLADDER TUMOR: SHX2575

## 2011-07-04 HISTORY — PX: CYSTOSCOPY W/ RETROGRADES: SHX1426

## 2011-07-04 SURGERY — CYSTOSCOPY, WITH RETROGRADE PYELOGRAM
Anesthesia: General | Wound class: Clean Contaminated

## 2011-07-04 MED ORDER — STERILE WATER FOR IRRIGATION IR SOLN
Status: DC | PRN
  Administered 2011-07-04: 3000 mL

## 2011-07-04 MED ORDER — IOHEXOL 300 MG/ML  SOLN
INTRAMUSCULAR | Status: DC | PRN
  Administered 2011-07-04: 28 mL

## 2011-07-04 MED ORDER — FENTANYL CITRATE 0.05 MG/ML IJ SOLN
25.0000 ug | INTRAMUSCULAR | Status: DC | PRN
  Administered 2011-07-04: 25 ug via INTRAVENOUS

## 2011-07-04 MED ORDER — 0.9 % SODIUM CHLORIDE (POUR BTL) OPTIME
TOPICAL | Status: DC | PRN
  Administered 2011-07-04: 1000 mL

## 2011-07-04 MED ORDER — CIPROFLOXACIN HCL 500 MG PO TABS: 500.0000 mg | ORAL_TABLET | Freq: Two times a day (BID) | ORAL | Status: DC

## 2011-07-04 MED ORDER — DEXAMETHASONE SODIUM PHOSPHATE 10 MG/ML IJ SOLN
INTRAMUSCULAR | Status: DC | PRN
  Administered 2011-07-04: 10 mg via INTRAVENOUS

## 2011-07-04 MED ORDER — CIPROFLOXACIN HCL 500 MG PO TABS: 500.0000 mg | ORAL_TABLET | Freq: Two times a day (BID) | ORAL | Status: AC

## 2011-07-04 MED ORDER — IOHEXOL 300 MG/ML  SOLN
INTRAMUSCULAR | Status: AC
Start: 2011-07-04 — End: 2011-07-04
  Filled 2011-07-04: qty 1

## 2011-07-04 MED ORDER — HYDROCODONE-ACETAMINOPHEN 5-300 MG PO TABS: 1.0000 | ORAL_TABLET | Freq: Four times a day (QID) | ORAL | Status: DC

## 2011-07-04 MED ORDER — PROPOFOL 10 MG/ML IV BOLUS
INTRAVENOUS | Status: DC | PRN
  Administered 2011-07-04: 150 mg via INTRAVENOUS

## 2011-07-04 MED ORDER — PROMETHAZINE HCL 25 MG/ML IJ SOLN: 6.2500 mg | INTRAMUSCULAR | Status: DC | PRN

## 2011-07-04 MED ORDER — MITOMYCIN CHEMO FOR BLADDER INSTILLATION 40 MG
40.0000 mg | Freq: Once | INTRAVENOUS | Status: DC
  Filled 2011-07-04: qty 40

## 2011-07-04 MED ORDER — FENTANYL CITRATE 0.05 MG/ML IJ SOLN
INTRAMUSCULAR | Status: DC | PRN
  Administered 2011-07-04: 100 ug via INTRAVENOUS

## 2011-07-04 MED ORDER — PHENAZOPYRIDINE HCL 100 MG PO TABS: 100.0000 mg | ORAL_TABLET | Freq: Three times a day (TID) | ORAL | Status: DC | PRN

## 2011-07-04 MED ORDER — FENTANYL CITRATE 0.05 MG/ML IJ SOLN
INTRAMUSCULAR | Status: AC
  Filled 2011-07-04: qty 2

## 2011-07-04 MED ORDER — DROPERIDOL 2.5 MG/ML IJ SOLN
INTRAMUSCULAR | Status: DC | PRN
  Administered 2011-07-04: 0.625 mg via INTRAVENOUS

## 2011-07-04 MED ORDER — LACTATED RINGERS IV SOLN
INTRAVENOUS | Status: DC | PRN
  Administered 2011-07-04: 07:00:00 via INTRAVENOUS

## 2011-07-04 MED ORDER — CIPROFLOXACIN IN D5W 400 MG/200ML IV SOLN
400.0000 mg | INTRAVENOUS | Status: AC
  Administered 2011-07-04: 400 mg via INTRAVENOUS

## 2011-07-04 MED ORDER — ONDANSETRON HCL 4 MG/2ML IJ SOLN
INTRAMUSCULAR | Status: DC | PRN
  Administered 2011-07-04: 4 mg via INTRAVENOUS

## 2011-07-04 MED ORDER — PHENAZOPYRIDINE HCL 100 MG PO TABS: 100.0000 mg | ORAL_TABLET | Freq: Three times a day (TID) | ORAL | Status: AC | PRN

## 2011-07-04 MED ORDER — INDIGOTINDISULFONATE SODIUM 8 MG/ML IJ SOLN
INTRAMUSCULAR | Status: AC
  Filled 2011-07-04: qty 5

## 2011-07-04 MED ORDER — CIPROFLOXACIN IN D5W 400 MG/200ML IV SOLN
INTRAVENOUS | Status: AC
Start: 2011-07-04 — End: 2011-07-04
  Filled 2011-07-04: qty 200

## 2011-07-04 MED ORDER — MIDAZOLAM HCL 5 MG/5ML IJ SOLN
INTRAMUSCULAR | Status: DC | PRN
  Administered 2011-07-04: 2 mg via INTRAVENOUS

## 2011-07-04 MED ORDER — KETOROLAC TROMETHAMINE 30 MG/ML IJ SOLN: 15.0000 mg | Freq: Once | INTRAMUSCULAR | Status: DC | PRN

## 2011-07-04 SURGICAL SUPPLY — 26 items
ADAPTER CATH URET PLST 4-6FR (CATHETERS) ×3 IMPLANT
BAG URINE DRAINAGE (UROLOGICAL SUPPLIES) ×3 IMPLANT
BAG URO CATCHER STRL LF (DRAPE) ×3 IMPLANT
CATH FOLEY 2WAY SLVR  5CC 16FR (CATHETERS) ×1
CATH FOLEY 2WAY SLVR 5CC 16FR (CATHETERS) ×2 IMPLANT
CATH INTERMIT  6FR 70CM (CATHETERS) ×3 IMPLANT
CATH URET 5FR 28IN CONE TIP (BALLOONS)
CATH URET 5FR 70CM CONE TIP (BALLOONS) IMPLANT
CLOTH BEACON ORANGE TIMEOUT ST (SAFETY) ×3 IMPLANT
DRAPE CAMERA CLOSED 9X96 (DRAPES) ×3 IMPLANT
ELECT REM PT RETURN 9FT ADLT (ELECTROSURGICAL) ×3
ELECTRODE REM PT RTRN 9FT ADLT (ELECTROSURGICAL) ×2 IMPLANT
EVACUATOR MICROVAS BLADDER (UROLOGICAL SUPPLIES) IMPLANT
GLOVE BIOGEL M STRL SZ7.5 (GLOVE) ×3 IMPLANT
GOWN STRL NON-REIN LRG LVL3 (GOWN DISPOSABLE) IMPLANT
GUIDEWIRE STR DUAL SENSOR (WIRE) ×3 IMPLANT
KIT ASPIRATION TUBING (SET/KITS/TRAYS/PACK) IMPLANT
LASER FIBER DISP (UROLOGICAL SUPPLIES) IMPLANT
LOOPS RESECTOSCOPE DISP (ELECTROSURGICAL) ×3 IMPLANT
MANIFOLD NEPTUNE II (INSTRUMENTS) ×3 IMPLANT
NS IRRIG 1000ML POUR BTL (IV SOLUTION) IMPLANT
PACK CYSTO (CUSTOM PROCEDURE TRAY) ×3 IMPLANT
PLUG CATH AND CAP STER (CATHETERS) ×3 IMPLANT
SYRINGE IRR TOOMEY STRL 70CC (SYRINGE) IMPLANT
TUBING CONNECTING 10 (TUBING) ×3 IMPLANT
WIRE COONS/BENSON .038X145CM (WIRE) IMPLANT

## 2011-07-04 NOTE — Op Note (Addendum)
Preoperative diagnosis: 1. Bladder tumor (1.5 cm)  Postoperative diagnosis:  1. Bladder tumor (1.5 cm)  Procedure:  1. Cystoscopy 2. Transurethral resection of bladder tumor (1.5 cm) 3. Bilateral retrograde pyelography with interpretation 4. Intravesical instillation of Mitomycin C   Surgeon: Rolly Salter, Montez Hageman. M.D.  Anesthesia: General  Complications: None  Intraoperative findings:  1. Bladder tumor: A 1.5 cm papillary bladder tumor was identified lateral to the left ureteral orifice. 2. Retrograde pyelography: There were no ureteral filling defects of the right or left ureters.  The right renal pelvis was slightly dilated consistent with an extra renal pelvis or mild UPJ obstruction.  No renal collecting filling defects were noted within the right or left renal collecting systems.  EBL: Minimal  Specimens: 1. Left lateral bladder tumor 2. Deep biopsies of bladder tumor  Disposition of specimens: Pathology  Indication: Donna [name] is a patient who was found to have a bladder tumor. After reviewing the management options for treatment, he elected to proceed with the above surgical procedure(s). We have discussed the potential benefits and risks of the procedure, side effects of the proposed treatment, the likelihood of the patient achieving the goals of the procedure, and any potential problems that might occur during the procedure or recuperation. Informed consent has been obtained.  Description of procedure:  The patient was taken to the operating room and general anesthesia was induced.  The patient was placed in the dorsal lithotomy position, prepped and draped in the usual sterile fashion, and preoperative antibiotics were administered. A preoperative time-out was performed.   Cystourethroscopy was performed.  The patient's urethra was examined and was normal.   The bladder was then systematically examined in its entirety.  A small 1.5 cm papillary bladder tumor was  noted just to the left of the ureteral orifice near her prior resection site.  No other tumors, mucosal abnormalities, or stones were noted.  Attention then turned to the right ureteral orifice and a ureteral catheter was used to intubate the ureteral orifice.  Omnipaque contrast was injected through the ureteral catheter and a retrograde pyelogram was performed with findings as dictated above.  Attention then turned to the left ureteral orifice and a ureteral catheter was used to intubate the ureteral orifice.  Omnipaque contrast was injected through the ureteral catheter and a retrograde pyelogram was performed with findings as dictated above.  The bladder was then re-examined after the resectoscope was placed.  The bladder tumor was 1.5 cm.  It was located to the left of the left ureteral orifice and appeared papillary. Paralysis was ensured. Using loop cautery resection, the entire tumor was resected and removed for permanent pathologic analysis.   Separate biopsies were also taken of the underlying deep bladder tissue and sent as a separate specimen.   Hemostasis was then achieved with the loop cautery and the bladder was emptied and reinspected with no further bleeding noted at the end of the procedure.    The bladder was then emptied and the procedure ended.  The patient appeared to tolerate the procedure well and without complications.  The patient was able to be awakened and transferred to the recovery unit in satisfactory condition.   40 cc of Mitomycin C was instilled in 20 cc of sterile water into the bladder via a catheter and left indwelling for 1 hr.   Moody Bruins. MD

## 2011-07-04 NOTE — Anesthesia Preprocedure Evaluation (Signed)
Anesthesia Evaluation  Patient identified by MRN, date of birth, ID band Patient awake    Reviewed: Allergy & Precautions, H&P , NPO status , Patient's Chart, lab work & pertinent test results  Airway Mallampati: II TM Distance: >3 FB Neck ROM: Full    Dental No notable dental hx.    Pulmonary neg pulmonary ROS,  breath sounds clear to auscultation  Pulmonary exam normal       Cardiovascular negative cardio ROS  Rhythm:Regular Rate:Normal     Neuro/Psych negative neurological ROS  negative psych ROS   GI/Hepatic Neg liver ROS, GERD-  Medicated,  Endo/Other  negative endocrine ROS  Renal/GU negative Renal ROS  negative genitourinary   Musculoskeletal negative musculoskeletal ROS (+)   Abdominal   Peds negative pediatric ROS (+)  Hematology negative hematology ROS (+)   Anesthesia Other Findings   Reproductive/Obstetrics negative OB ROS                           Anesthesia Physical Anesthesia Plan  ASA: II  Anesthesia Plan: General   Post-op Pain Management:    Induction: Intravenous  Airway Management Planned: LMA  Additional Equipment:   Intra-op Plan:   Post-operative Plan: Extubation in OR  Informed Consent: I have reviewed the patients History and Physical, chart, labs and discussed the procedure including the risks, benefits and alternatives for the proposed anesthesia with the patient or authorized representative who has indicated his/her understanding and acceptance.   Dental advisory given  Plan Discussed with: CRNA  Anesthesia Plan Comments:         Anesthesia Quick Evaluation

## 2011-07-04 NOTE — Interval H&P Note (Signed)
History and Physical Interval Note:  07/04/2011 7:19 AM  Donna [name]  has presented today for surgery, with the diagnosis of BLADDER CANCER  The various methods of treatment have been discussed with the patient and family. After consideration of risks, benefits and other options for treatment, the patient has consented to  Procedure(s) (LRB): CYSTOSCOPY WITH RETROGRADE PYELOGRAM (Bilateral) TRANSURETHRAL RESECTION OF BLADDER TUMOR (TURBT) (N/A) as a surgical intervention .  The patients' history has been reviewed, patient examined, no change in status, stable for surgery.  Questions were answered to the patient's satisfaction.     Cleveland Paiz,LES

## 2011-07-04 NOTE — Discharge Instructions (Signed)
1. You may see some blood in the urine and may have some burning with urination for 48-72 hours. You also may notice that you have to urinate more frequently or urgently after your procedure which is normal.  2. You should call should you develop an inability urinate, fever > 101, persistent nausea and vomiting that prevents you from eating or drinking to stay hydrated.  3. Take your pain medication as needed for pain.  Take Pyridium as needed for burning.

## 2011-07-04 NOTE — Transfer of Care (Signed)
Immediate Anesthesia Transfer of Care Note  Patient: Donna [name]  Procedure(s) Performed: Procedure(s) (LRB): CYSTOSCOPY WITH RETROGRADE PYELOGRAM (Bilateral) TRANSURETHRAL RESECTION OF BLADDER TUMOR (TURBT) (N/A)  Patient Location: PACU  Anesthesia Type: General  Level of Consciousness: awake, alert  and patient cooperative  Airway & Oxygen Therapy: Patient Spontanous Breathing and Patient connected to face mask oxygen  Post-op Assessment: Report given to PACU RN and Post -op Vital signs reviewed and stable  Post vital signs: Reviewed and stable  Complications: No apparent anesthesia complications

## 2011-07-04 NOTE — Anesthesia Postprocedure Evaluation (Signed)
  Anesthesia Post-op Note  Patient: Donna [name]  Procedure(s) Performed: Procedure(s) (LRB): CYSTOSCOPY WITH RETROGRADE PYELOGRAM (Bilateral) TRANSURETHRAL RESECTION OF BLADDER TUMOR (TURBT) (N/A)  Patient Location: PACU  Anesthesia Type: General  Level of Consciousness: awake and alert   Airway and Oxygen Therapy: Patient Spontanous Breathing  Post-op Pain: mild  Post-op Assessment: Post-op Vital signs reviewed, Patient's Cardiovascular Status Stable, Respiratory Function Stable, Patent Airway and No signs of Nausea or vomiting  Post-op Vital Signs: stable  Complications: No apparent anesthesia complications

## 2011-07-06 ENCOUNTER — Encounter (HOSPITAL_COMMUNITY): Payer: Self-pay | Admitting: Urology

## 2011-08-16 ENCOUNTER — Other Ambulatory Visit: Payer: Self-pay | Admitting: Internal Medicine

## 2011-08-16 DIAGNOSIS — Z1231 Encounter for screening mammogram for malignant neoplasm of breast: Secondary | ICD-10-CM

## 2011-09-20 ENCOUNTER — Ambulatory Visit (INDEPENDENT_AMBULATORY_CARE_PROVIDER_SITE_OTHER): Payer: BC Managed Care – PPO | Admitting: Gynecology

## 2011-09-20 ENCOUNTER — Encounter: Payer: Self-pay | Admitting: Gynecology

## 2011-09-20 VITALS — BP 110/66 | Ht 67.0 in | Wt 161.0 lb

## 2011-09-20 DIAGNOSIS — C801 Malignant (primary) neoplasm, unspecified: Secondary | ICD-10-CM | POA: Insufficient documentation

## 2011-09-20 DIAGNOSIS — Z01419 Encounter for gynecological examination (general) (routine) without abnormal findings: Secondary | ICD-10-CM

## 2011-09-20 DIAGNOSIS — N952 Postmenopausal atrophic vaginitis: Secondary | ICD-10-CM

## 2011-09-20 NOTE — Progress Notes (Signed)
Donna [name] 09/17/1945 [id number]        66 y.o.  G3P3003 new patient for annual exam.  Former patient of Dr. Algis Downs. [facility]. Several issues noted below.  Past medical history,surgical history, medications, allergies, family history and social history were all reviewed and documented in the EPIC chart. ROS:  Was performed and pertinent positives and negatives are included in the history.  Exam: Kim assistant Filed Vitals:   09/20/11 1439  BP: 110/66  Height: 5\' 7"  (1.702 m)  Weight: 161 lb (73.029 kg)   General appearance  Normal Skin grossly normal Head/Neck normal with no cervical or supraclavicular adenopathy thyroid normal Lungs  clear Cardiac RR, without RMG Abdominal  soft, nontender, without masses, organomegaly or hernia Breasts  examined lying and sitting without masses, retractions, discharge or axillary adenopathy. Pelvic  Ext/BUS/vagina  normal atrophic genital changes  Cervix  normal   Uterus  anteverted, normal size, shape and contour, midline and mobile nontender   Adnexa  Without masses or tenderness    Anus and perineum  normal   Rectovaginal  normal sphincter tone without palpated masses or tenderness.    Assessment/Plan:  65 y.o. G31P3003 female for annual exam.   1. Postmenopausal/atrophic genital changes without symptoms/bleeding. We'll continue to monitor. 2. History bladder cancer recent recurrence 2013 status post resection with intra-bladder irrigation of chemotherapy. Currently being monitored with follow up appointment in several months with the urologist. 3. Mammography. Patient has mammogram scheduled this week.  Will follow up for this. SBE monthly reviewed. 4. Pap smear. No history of abnormal Pap smears before. Last Pap smear 2012. No Pap smear done today. I reviewed current screening guidelines and option to stop altogether as she is 66 versus less frequent screening reviewed and we'll readdress on an annual basis. 5. DEXA. Patient's DEXA 2011. She  is actively followed by Dr. Neale Burly in reference to this and will continue to follow up with him.  Increase calcium vitamin D reviewed. 6. Colonoscopy. Patient had colonoscopy last year we'll follow up with them at recommended screening interval. 7. Health maintenance. No blood work was done today as it is all done through Dr. Rada Hay and she will continue to see him routinely.    Dara Lords MD, 3:54 PM 09/20/2011

## 2011-09-20 NOTE — Patient Instructions (Signed)
Follow up in one year for annual gynecologic exam. 

## 2011-09-23 ENCOUNTER — Ambulatory Visit
Admission: RE | Admit: 2011-09-23 | Discharge: 2011-09-23 | Disposition: A | Discharge status: Home / Self Care | Payer: BC Managed Care – PPO | Source: Ambulatory Visit | Attending: Internal Medicine | Admitting: Internal Medicine

## 2011-09-23 DIAGNOSIS — Z1231 Encounter for screening mammogram for malignant neoplasm of breast: Secondary | ICD-10-CM

## 2012-08-20 ENCOUNTER — Other Ambulatory Visit: Payer: Self-pay

## 2012-08-20 DIAGNOSIS — Z1231 Encounter for screening mammogram for malignant neoplasm of breast: Secondary | ICD-10-CM

## 2012-09-24 ENCOUNTER — Ambulatory Visit
Admission: RE | Admit: 2012-09-24 | Discharge: 2012-09-24 | Disposition: A | Discharge status: Home / Self Care | Payer: BC Managed Care – PPO | Source: Ambulatory Visit

## 2012-09-24 DIAGNOSIS — Z1231 Encounter for screening mammogram for malignant neoplasm of breast: Secondary | ICD-10-CM

## 2012-09-25 ENCOUNTER — Encounter: Payer: Self-pay | Admitting: Gynecology

## 2012-09-25 ENCOUNTER — Ambulatory Visit (INDEPENDENT_AMBULATORY_CARE_PROVIDER_SITE_OTHER): Payer: BC Managed Care – PPO | Admitting: Gynecology

## 2012-09-25 VITALS — BP 120/74 | Ht 67.0 in | Wt 158.0 lb

## 2012-09-25 DIAGNOSIS — N952 Postmenopausal atrophic vaginitis: Secondary | ICD-10-CM

## 2012-09-25 DIAGNOSIS — C679 Malignant neoplasm of bladder, unspecified: Secondary | ICD-10-CM

## 2012-09-25 NOTE — Patient Instructions (Signed)
Followup in one year for her gynecologic exam. Sooner as needed.

## 2012-09-25 NOTE — Progress Notes (Signed)
YANELIS OSIKA 1945-03-09 [id number]        67 y.o.  G3P3003 for annual exam.  Several issues noted below.  Past medical history,surgical history, medications, allergies, family history and social history were all reviewed and documented in the EPIC chart.  ROS:  Performed and pertinent positives and negatives are included in the history, assessment and plan .  Exam: Kim assistant Filed Vitals:   09/25/12 0953  BP: 120/74  Height: 5\' 7"  (1.702 m)  Weight: 158 lb (71.668 kg)   General appearance  Normal Skin grossly normal Head/Neck normal with no cervical or supraclavicular adenopathy thyroid normal Lungs  clear Cardiac RR, without RMG Abdominal  soft, nontender, without masses, organomegaly or hernia Breasts  examined lying and sitting without masses, retractions, discharge or axillary adenopathy. Pelvic  Ext/BUS/vagina  normal with atrophic changes  Cervix  normal with atrophic changes  Uterus  axial, normal size, shape and contour, midline and mobile nontender   Adnexa  Without masses or tenderness    Anus and perineum  normal   Rectovaginal  normal sphincter tone without palpated masses or tenderness.    Assessment/Plan:  67 y.o. G3P3003 female for annual exam.   1. Bladder cancer. Recent resection of recurrence last year with intra-bladder chemotherapy. Doing well with regular followups with Dr. Laverle Patter. 2. Postmenopausal/atrophic genital changes. Patient asymptomatic without significant hot flushes night sweats vaginal dryness or dyspareunia. We'll continue to monitor. 3. Mammography 09/2012. Continue with annual mammography. SBE monthly reviewed. 4. DEXA 2013 per her primary physician's office. Do not have a copy of this report. She continued followup with them per their recommendations. Increase calcium vitamin D reviewed. 5. Pap smear 2012. No Pap smear done today. No history of significant abnormal Pap smears. Plan repeat Pap smear next year at a 3 year interval. Will  discuss at that point stop screening versus less frequent screening intervals as she is over the age of 32. 6. Colonoscopy 2007. Recommended repeat a 10 year interval. 7. Health maintenance. No lab work done as it is all done through her primary physician's office as well as Dr. Vevelyn Royals office. Followup one year, sooner as needed.  Note: This document was prepared with digital dictation and possible smart phrase technology. Any transcriptional errors that result from this process are unintentional.   Dara Lords MD, 10:29 AM 09/25/2012

## 2013-05-17 ENCOUNTER — Other Ambulatory Visit: Payer: Self-pay | Admitting: Urology

## 2013-05-23 ENCOUNTER — Encounter (HOSPITAL_COMMUNITY): Payer: Self-pay | Admitting: Pharmacy Technician

## 2013-05-23 NOTE — Patient Instructions (Addendum)
      Your procedure is scheduled on: 05/27/13  MONDAY   Report to [facility] at   0830    AM.  Call this number if you have problems the morning of surgery: [phone]        Do not eat food  Or drink :After Midnight. Sunday NIGHT   Take these medicines the morning of surgery with A SIP OF WATER:No Regular medicine- may take Zyrtec if needed   .  Contacts, dentures or partial plates, or metal hairpins  can not be worn to surgery. Your family will be responsible for glasses, dentures, hearing aides while you are in surgery  Leave suitcase in the car. After surgery it may be brought to your room.  For patients admitted to the hospital, checkout time is 11:00 AM day of  discharge.                DO NOT WEAR JEWELRY, LOTIONS, POWDERS, OR PERFUMES.  WOMEN-- DO NOT SHAVE LEGS OR UNDERARMS FOR 48 HOURS BEFORE SHOWERS. MEN MAY SHAVE FACE.  Patients discharged the day of surgery will not be allowed to drive home. IF going home the day of surgery, you must have a driver and someone to stay with you for the first 24 hours  Name and phone number of your driver:   Penn Wynne                                                  Patient Signature _____________________________

## 2013-05-24 ENCOUNTER — Encounter (HOSPITAL_COMMUNITY)
Admission: RE | Admit: 2013-05-24 | Discharge: 2013-05-24 | Disposition: A | Discharge status: Home / Self Care | Payer: BC Managed Care – PPO | Source: Ambulatory Visit | Attending: Urology | Admitting: Urology

## 2013-05-24 ENCOUNTER — Ambulatory Visit (HOSPITAL_COMMUNITY)
Admission: RE | Admit: 2013-05-24 | Discharge: 2013-05-24 | Disposition: A | Discharge status: Home / Self Care | Payer: BC Managed Care – PPO | Source: Ambulatory Visit | Attending: Urology | Admitting: Urology

## 2013-05-24 ENCOUNTER — Encounter (HOSPITAL_COMMUNITY): Payer: Self-pay

## 2013-05-24 DIAGNOSIS — Z01812 Encounter for preprocedural laboratory examination: Secondary | ICD-10-CM | POA: Insufficient documentation

## 2013-05-24 DIAGNOSIS — Z01818 Encounter for other preprocedural examination: Secondary | ICD-10-CM | POA: Insufficient documentation

## 2013-05-24 DIAGNOSIS — R9431 Abnormal electrocardiogram [ECG] [EKG]: Secondary | ICD-10-CM | POA: Insufficient documentation

## 2013-05-24 DIAGNOSIS — Z01810 Encounter for preprocedural cardiovascular examination: Secondary | ICD-10-CM | POA: Insufficient documentation

## 2013-05-24 LAB — CBC
HCT: 44 % (ref 36.0–46.0)
Hemoglobin: 14.6 g/dL (ref 12.0–15.0)
MCH: 30 pg (ref 26.0–34.0)
MCHC: 33.2 g/dL (ref 30.0–36.0)
MCV: 90.5 fL (ref 78.0–100.0)
PLATELETS: 241 10*3/uL (ref 150–400)
RBC: 4.86 MIL/uL (ref 3.87–5.11)
RDW: 13 % (ref 11.5–15.5)
WBC: 4.7 10*3/uL (ref 4.0–10.5)

## 2013-05-24 LAB — BASIC METABOLIC PANEL
BUN: 16 mg/dL (ref 6–23)
CALCIUM: 9.8 mg/dL (ref 8.4–10.5)
CO2: 25 meq/L (ref 19–32)
CREATININE: 0.8 mg/dL (ref 0.50–1.10)
Chloride: 103 mEq/L (ref 96–112)
GFR calc Af Amer: 86 mL/min — ABNORMAL LOW (ref >90)
GFR, EST NON AFRICAN AMERICAN: 75 mL/min — AB (ref >90)
Glucose, Bld: 113 mg/dL — ABNORMAL HIGH (ref 70–99)
Potassium: 4.3 mEq/L (ref 3.7–5.3)
Sodium: 141 mEq/L (ref 137–147)

## 2013-05-24 NOTE — Progress Notes (Signed)
EKG from 2011 Dr Sonda Primes office obtained and placed on chart for comparison

## 2013-05-26 NOTE — H&P (Signed)
History of Present Illness Donna [name] is a 68 year old previously cared for by Dr. Reece Agar with the following urologic history:    1) Urothelial carcinoma of the bladder: She has a long history of low-grade, Ta urothelial carcinoma of the bladder.    May 2000: TURBT  Sep 2000: TURBT  Mar 2001: TURBT  Sep 2001: TURBT  Sep-Oct 2001: BCG (6 week induction)  Aug 2002: TURBT  May 2003: TURBT  May 2005: TURBT  Feb 2006: TURBT [facility] post-op)  Apr 2013: TURBT [facility] post-op) - low grade, Ta    Interval history:    She follows up today for surveillance of her bladder cancer. She denies any hematuria or change in her voiding habits. She remains in stable overall health.     Past Medical History Problems  1. History of Heart Rhythm Irregular  Surgical History Problems  1. History of Bladder Injection Of Cancer Treatment 2. History of Cystoscopy With Fulguration Small Lesion (5-36mm)  Current Meds 1. No Medications;  Therapy: (Recorded:11Sep2013) to Recorded  Allergies Medication  1. No Known Drug Allergies  Family History Problems  1. Denied: Family history of Bladder Cancer 2. Family history of cardiac disorder (V17.49) : Mother 3. Family history of malignant neoplasm of stomach (V16.0) : Father  Social History Problems  1. Alcohol Use   2 on weekends 2. Family history of Death In The Family Father   age 51 stomach cancer 45. Family history of Death In The Family Mother   age 18 heart 37. Former smoker (V15.82)   [id number] quit in 1981 5. Marital History - Currently Married 6. Occupation:   Financial risk analyst  Review of Systems  Genitourinary: no hematuria.  Cardiovascular: no chest pain.  Respiratory: no shortness of breath.    Vitals Vital Signs [Data Includes: Last 1 Day]  Recorded: [id number] 04:12PM  Blood Pressure: 124 / 67 Heart Rate: 63  Physical Exam Constitutional: Well nourished and well developed . No acute distress.  Pulmonary: No  respiratory distress and normal respiratory rhythm and effort.  Cardiovascular: Heart rate and rhythm are normal . No peripheral edema.  Genitourinary:   Examination of the external genitalia shows normal female external genitalia.  Neuro/Psych:. Mood and affect are appropriate.    Results/Data Urine [Data Includes: Last 1 Day]   [id number]  COLOR YELLOW   APPEARANCE CLEAR   SPECIFIC GRAVITY <1.005   pH 6.0   GLUCOSE NEG mg/dL  BILIRUBIN NEG   KETONE NEG mg/dL  BLOOD NEG   PROTEIN NEG mg/dL  UROBILINOGEN 0.2 mg/dL  NITRITE NEG   LEUKOCYTE ESTERASE NEG    Procedure  Procedure: Cystoscopy  Chaperone Present: Heather S.  Indication: History of Urothelial Carcinoma.  Informed Consent: Risks, benefits, and potential adverse events were discussed and informed consent was obtained from the patient.  Prep: The patient was prepped with betadine.  Anesthesia:. Local anesthesia was administered intraurethrally with 2% lidocaine jelly.  Antibiotic prophylaxis: Ciprofloxacin.  Procedure Note:  Urethral meatus:. No abnormalities.  Anterior urethra: No abnormalities.  Bladder: Visulization was clear. The ureteral orifices were in the normal anatomic position bilaterally and had clear efflux of urine. A systematic examination of the bladder was performed. Ureteral orifices were identified in their expected anatomic location and effluxing clear urine. There was noted to be a small papillary bladder tumor just beyond the left ureteral orifice measuring 0.5 centimeters. In addition, there were 2 small areas on either side of the ureteral orifice which were suspicious for the  beginning of small tumors. No other bladder tumors or other mucosal pathology was identified on evaluation of the remainder of the bladder. A saline bladder washing was obtained and sent for cytologic analysis. The patient tolerated the procedure well.  Complications: None.    Assessment Assessed  1. Bladder cancer  (188.9)  Plan Bladder cancer  1. Follow-up Office  Follow-up will call to schedule surgery  Status: Hold For - Date of  Service  Requested for: 12Mar2015 2. URINE CYTOLOGY; Status:In Progress - Specimen/Data Collected;   Done: [id number] Health Maintenance  3. UA With REFLEX; [Do Not Release]; Status:Complete;   Done: [id number] 04:03PM  Discussion/Summary 1. Urothelial carcinoma of the bladder: We discussed her cystoscopic findings today which include multiple small recurrent papillary bladder tumors. These are most likely consistent with low-grade tumor similar to her prior tumors. I did recommend that she proceed with cystoscopy, examination under anesthesia, bilateral retrograde pyelography, and transurethral resection of her bladder tumors. Considering the location of the bladder tumors near the left ureteral orifice, we discussed the possible need for a postoperative ureteral stent. If she does not require a stent, we discussed proceeding with postoperative mitomycin C instillation intravesically which she has had during her prior resections. We reviewed the potential risks, complications, and alternative options associated with this procedure. She agrees to proceed and this will be scheduled for the near future. We also briefly discussed the option of proceeding with intravesical BCG in the future as a way to reduce her recurrences considering this has been an ongoing issue for her over the past 15 years. We will await her pathology and further discuss her options at her postoperative visit.    Cc: Dr. Domenick Gong   A total of 1  1,2  20 2   minutes were spent in the overall care of the patient today1  with 15 minutes in direct face to face consultation.1      1 Amended By: Raynelle Bring; May 16 2013 4:47 PM EST  2 Amended By: Raynelle Bring; May 16 2013 4:48 PM EST  Verified Results URINE CYTOLOGY1 [id number] 04:42PM1 Read Drivers  SPECIMEN TYPE: OTHER   Test Name Result Flag  Reference  FINAL DIAGNOSIS:1     - NO MALIGNANT CELLS IDENTIFIED. - BENIGN REACTIVE UROTHELIAL CELLS ARE PRESENT.  SOURCE:1 Bladder Washing1    110CC OF CLEAR BLW RECEIVED 1 SLIDE PREPARED [id number]  Relevant Clinical Info1 BLADDER CANCER1    PATHOLOGIST:1     REVIEWED BY S. SERDAR DEMIRCI, MD, (ELECTRONIC SIGNATURE ON FILE)  NUMBER OF SLIDES1     1 Container Submitted  CYTOTECHNOLOGIST:1     MJS, BA CT(ASCP)     1. Amended By: Raynelle Bring; May 17 2013 5:53 PM EST  Signatures

## 2013-05-27 ENCOUNTER — Encounter (HOSPITAL_COMMUNITY): Payer: BC Managed Care – PPO | Admitting: Anesthesiology

## 2013-05-27 ENCOUNTER — Encounter (HOSPITAL_COMMUNITY): Payer: Self-pay | Admitting: *Deleted

## 2013-05-27 ENCOUNTER — Encounter (HOSPITAL_COMMUNITY)
Admission: RE | Disposition: A | Discharge status: Home / Self Care | Payer: Self-pay | Source: Ambulatory Visit | Attending: Urology

## 2013-05-27 ENCOUNTER — Ambulatory Visit (HOSPITAL_COMMUNITY): Payer: BC Managed Care – PPO

## 2013-05-27 ENCOUNTER — Ambulatory Visit (HOSPITAL_COMMUNITY)
Admission: RE | Admit: 2013-05-27 | Discharge: 2013-05-27 | Disposition: A | Discharge status: Home / Self Care | Payer: BC Managed Care – PPO | Source: Ambulatory Visit | Attending: Urology | Admitting: Urology

## 2013-05-27 ENCOUNTER — Ambulatory Visit (HOSPITAL_COMMUNITY): Payer: BC Managed Care – PPO | Admitting: Anesthesiology

## 2013-05-27 DIAGNOSIS — R112 Nausea with vomiting, unspecified: Secondary | ICD-10-CM | POA: Insufficient documentation

## 2013-05-27 DIAGNOSIS — C679 Malignant neoplasm of bladder, unspecified: Secondary | ICD-10-CM | POA: Insufficient documentation

## 2013-05-27 DIAGNOSIS — I499 Cardiac arrhythmia, unspecified: Secondary | ICD-10-CM | POA: Insufficient documentation

## 2013-05-27 DIAGNOSIS — Z87891 Personal history of nicotine dependence: Secondary | ICD-10-CM | POA: Insufficient documentation

## 2013-05-27 HISTORY — PX: CYSTOSCOPY W/ URETERAL STENT PLACEMENT: SHX1429

## 2013-05-27 HISTORY — PX: TRANSURETHRAL RESECTION OF BLADDER TUMOR: SHX2575

## 2013-05-27 SURGERY — CYSTOSCOPY, WITH RETROGRADE PYELOGRAM AND URETERAL STENT INSERTION
Anesthesia: General

## 2013-05-27 MED ORDER — DEXAMETHASONE SODIUM PHOSPHATE 10 MG/ML IJ SOLN
INTRAMUSCULAR | Status: DC | PRN
  Administered 2013-05-27: 10 mg via INTRAVENOUS

## 2013-05-27 MED ORDER — SODIUM CHLORIDE 0.9 % IR SOLN
Status: DC | PRN
  Administered 2013-05-27: 6000 mL

## 2013-05-27 MED ORDER — ONDANSETRON HCL 4 MG/2ML IJ SOLN
INTRAMUSCULAR | Status: AC
  Filled 2013-05-27: qty 2

## 2013-05-27 MED ORDER — FENTANYL CITRATE 0.05 MG/ML IJ SOLN
25.0000 ug | INTRAMUSCULAR | Status: DC | PRN
  Administered 2013-05-27: 25 ug via INTRAVENOUS
  Administered 2013-05-27: 50 ug via INTRAVENOUS

## 2013-05-27 MED ORDER — FENTANYL CITRATE 0.05 MG/ML IJ SOLN
INTRAMUSCULAR | Status: AC
  Filled 2013-05-27: qty 2

## 2013-05-27 MED ORDER — DEXAMETHASONE SODIUM PHOSPHATE 10 MG/ML IJ SOLN
INTRAMUSCULAR | Status: AC
  Filled 2013-05-27: qty 1

## 2013-05-27 MED ORDER — PROPOFOL 10 MG/ML IV BOLUS
INTRAVENOUS | Status: DC | PRN
  Administered 2013-05-27: 180 mg via INTRAVENOUS

## 2013-05-27 MED ORDER — ONDANSETRON HCL 4 MG/2ML IJ SOLN
INTRAMUSCULAR | Status: DC | PRN
  Administered 2013-05-27: 4 mg via INTRAVENOUS

## 2013-05-27 MED ORDER — MIDAZOLAM HCL 5 MG/5ML IJ SOLN
INTRAMUSCULAR | Status: DC | PRN
  Administered 2013-05-27: 2 mg via INTRAVENOUS

## 2013-05-27 MED ORDER — PROMETHAZINE HCL 25 MG/ML IJ SOLN
6.2500 mg | INTRAMUSCULAR | Status: AC | PRN
  Administered 2013-05-27 (×2): 6.25 mg via INTRAVENOUS
  Filled 2013-05-27: qty 1

## 2013-05-27 MED ORDER — HYDROCODONE-ACETAMINOPHEN 5-325 MG PO TABS: 1.0000 | ORAL_TABLET | Freq: Four times a day (QID) | ORAL | Status: DC | PRN

## 2013-05-27 MED ORDER — IOHEXOL 300 MG/ML  SOLN
INTRAMUSCULAR | Status: DC | PRN
  Administered 2013-05-27: 19 mL

## 2013-05-27 MED ORDER — CIPROFLOXACIN IN D5W 400 MG/200ML IV SOLN
400.0000 mg | INTRAVENOUS | Status: AC
  Administered 2013-05-27: 400 mg via INTRAVENOUS

## 2013-05-27 MED ORDER — MIDAZOLAM HCL 2 MG/2ML IJ SOLN
INTRAMUSCULAR | Status: AC
  Filled 2013-05-27: qty 2

## 2013-05-27 MED ORDER — PHENYLEPHRINE 40 MCG/ML (10ML) SYRINGE FOR IV PUSH (FOR BLOOD PRESSURE SUPPORT)
PREFILLED_SYRINGE | INTRAVENOUS | Status: AC
  Filled 2013-05-27: qty 10

## 2013-05-27 MED ORDER — PHENYLEPHRINE HCL 10 MG/ML IJ SOLN
INTRAMUSCULAR | Status: DC | PRN
  Administered 2013-05-27: 40 ug via INTRAVENOUS

## 2013-05-27 MED ORDER — CIPROFLOXACIN HCL 250 MG PO TABS: 250.0000 mg | ORAL_TABLET | Freq: Two times a day (BID) | ORAL | Status: DC

## 2013-05-27 MED ORDER — PROPOFOL 10 MG/ML IV BOLUS
INTRAVENOUS | Status: AC
  Filled 2013-05-27: qty 20

## 2013-05-27 MED ORDER — LACTATED RINGERS IV SOLN
INTRAVENOUS | Status: DC
Start: 2013-05-27 — End: 2013-05-27
  Administered 2013-05-27: 1000 mL via INTRAVENOUS

## 2013-05-27 MED ORDER — CIPROFLOXACIN IN D5W 400 MG/200ML IV SOLN
INTRAVENOUS | Status: AC
  Filled 2013-05-27: qty 200

## 2013-05-27 MED ORDER — FENTANYL CITRATE 0.05 MG/ML IJ SOLN
INTRAMUSCULAR | Status: DC | PRN
  Administered 2013-05-27 (×2): 25 ug via INTRAVENOUS
  Administered 2013-05-27 (×2): 12.5 ug via INTRAVENOUS
  Administered 2013-05-27: 25 ug via INTRAVENOUS

## 2013-05-27 SURGICAL SUPPLY — 18 items
BAG URINE DRAINAGE (UROLOGICAL SUPPLIES) IMPLANT
BAG URO CATCHER STRL LF (DRAPE) ×3 IMPLANT
CATH INTERMIT  6FR 70CM (CATHETERS) ×3 IMPLANT
DRAPE CAMERA CLOSED 9X96 (DRAPES) ×3 IMPLANT
ELECT LOOP MED HF 24F 12D CBL (CLIP) ×3 IMPLANT
ELECT REM PT RETURN 9FT ADLT (ELECTROSURGICAL)
ELECTRODE REM PT RTRN 9FT ADLT (ELECTROSURGICAL) IMPLANT
EVACUATOR MICROVAS BLADDER (UROLOGICAL SUPPLIES) IMPLANT
GLOVE BIOGEL M STRL SZ7.5 (GLOVE) ×3 IMPLANT
GOWN STRL REUS W/TWL LRG LVL3 (GOWN DISPOSABLE) ×3 IMPLANT
GUIDEWIRE STR DUAL SENSOR (WIRE) ×3 IMPLANT
KIT ASPIRATION TUBING (SET/KITS/TRAYS/PACK) IMPLANT
LOOPS RESECTOSCOPE DISP (ELECTROSURGICAL) ×3 IMPLANT
MANIFOLD NEPTUNE II (INSTRUMENTS) ×3 IMPLANT
PACK CYSTO (CUSTOM PROCEDURE TRAY) ×3 IMPLANT
STENT CONTOUR 6FRX24X.038 (STENTS) ×3 IMPLANT
SYRINGE IRR TOOMEY STRL 70CC (SYRINGE) IMPLANT
TUBING CONNECTING 10 (TUBING) ×3 IMPLANT

## 2013-05-27 NOTE — Transfer of Care (Signed)
Immediate Anesthesia Transfer of Care Note  Patient: Donna [name]  Procedure(s) Performed: Procedure(s) (LRB): CYSTOSCOPY WITH RETROGRADE PYELOGRAM/URETERAL STENT PLACEMENT LEFT (Bilateral) TRANSURETHRAL RESECTION OF BLADDER TUMOR (TURBT) (N/A)  Patient Location: PACU  Anesthesia Type: General  Level of Consciousness: sedated, patient cooperative and responds to stimulation  Airway & Oxygen Therapy: Patient Spontanous Breathing and Patient connected to face mask oxgen  Post-op Assessment: Report given to PACU RN and Post -op Vital signs reviewed and stable  Post vital signs: Reviewed and stable  Complications: No apparent anesthesia complications

## 2013-05-27 NOTE — Discharge Instructions (Signed)

## 2013-05-27 NOTE — Op Note (Signed)
Preoperative diagnosis: 1. Bladder tumor (0.5 cm)  Postoperative diagnosis:  1. Bladder tumor (0.5 cm)  Procedure:  1. Cystoscopy 2. Transurethral resection of bladder tumor (0.5 cm) 3. Bilateral retrograde pyelography 4. Left ureteral stent placement (6 x 24 with string)  Surgeon: Roxy Horseman, Brooke Bonito. M.D.  Anesthesia: General  Complications: None  Intraoperative findings:  1. Bladder tumor: 0.5 centimeter papillary tumor just beyond the left ureteral orifice. 2. Retrograde pyelography: The left ureter appeared to be of normal caliber and without filling defects.  The left renal pelvis was mildly dilated and capacious without filling defects.  No hydronephrosis was noted.  The right ureter was of normal caliber and without fixed filling defects.  The right renal pelvis was also mildly capacious similar to the contralateral side and also without filling defects.  EBL: Minimal  Specimens: 1. Bladder tumor  Disposition of specimens: Pathology  Indication: Donna [name] is a patient with a history low-grade urothelial carcinoma of the bladder. She was recently noted to have a recurrent small papillary tumor noted just beyond the left ureteral orifice along with some abnormal mucosa surrounding the left ureteral orifice that was suspicious for tumor. After reviewing the management options for treatment, he elected to proceed with the above surgical procedure(s). We have discussed the potential benefits and risks of the procedure, side effects of the proposed treatment, the likelihood of the patient achieving the goals of the procedure, and any potential problems that might occur during the procedure or recuperation. Informed consent has been obtained.  Description of procedure:  The patient was taken to the operating room and general anesthesia was induced.  The patient was placed in the dorsal lithotomy position, prepped and draped in the usual sterile fashion, and preoperative  antibiotics were administered. A preoperative time-out was performed.   Cystourethroscopy was performed.  The patient's urethra was examined and was normal.   The bladder was then systematically examined in its entirety. The bladder was visualized with both a 12 and 70 lens.  There was noted to be a small papillary tumor measuring approximately 0.5 centimeters just beyond the left ureteral orifice overlying the intramural ureter.  In addition, there was some abnormal and raised mucosa surrounding the left ureteral orifice suspicious for possible tumor.  Attention then turned to the left ureteral orifice and a ureteral catheter was used to intubate the ureteral orifice.  Omnipaque contrast was injected through the ureteral catheter and a retrograde pyelogram was performed with findings as dictated above.  Attention then turned to the right ureteral orifice and a ureteral catheter was used to intubate the ureteral orifice.  Omnipaque contrast was injected through the ureteral catheter and a retrograde pyelogram was performed with findings as dictated above.  The bladder was then re-examined after the resectoscope was placed. Considering the location of the tumor, I elected to place a ureteral stent prior to resection.  A 0.38 sensor guidewire was advanced up the left ureter and into the renal pelvis under fluoroscopic guidance.  A 6 x 24 double-J ureteral stent was advanced over the wire using Seldinger technique and positioned appropriately under fluoroscopic and cystoscopic guidance.Using loop cautery resection, the entire tumor was resected and removed for permanent pathologic analysis. In addition, the surrounding abnormal mucosa was also resected.  Narrow band imaging was utilized to help confirm and distinguish the abnormal mucosa from the normal mucosa.  Hemostasis was then achieved with the loop cautery and the bladder was emptied and reinspected with no further bleeding  noted at the end of the  procedure.    The bladder was then emptied and the procedure ended.  The patient appeared to tolerate the procedure well and without complications.  The patient was able to be awakened and transferred to the recovery unit in satisfactory condition.    Pryor Curia MD

## 2013-05-27 NOTE — Anesthesia Preprocedure Evaluation (Signed)
Anesthesia Evaluation  Patient identified by MRN, date of birth, ID band Patient awake    Reviewed: Allergy & Precautions, H&P , NPO status , Patient's Chart, lab work & pertinent test results  Airway Mallampati: II TM Distance: >3 FB Neck ROM: Full    Dental no notable dental hx.    Pulmonary neg pulmonary ROS, former smoker,  breath sounds clear to auscultation  Pulmonary exam normal       Cardiovascular Exercise Tolerance: Good negative cardio ROS  Rhythm:Regular Rate:Normal     Neuro/Psych negative neurological ROS  negative psych ROS   GI/Hepatic negative GI ROS, Neg liver ROS,   Endo/Other  negative endocrine ROS  Renal/GU negative Renal ROS  negative genitourinary   Musculoskeletal negative musculoskeletal ROS (+)   Abdominal   Peds negative pediatric ROS (+)  Hematology negative hematology ROS (+)   Anesthesia Other Findings   Reproductive/Obstetrics negative OB ROS                           Anesthesia Physical Anesthesia Plan  ASA: II  Anesthesia Plan: General   Post-op Pain Management:    Induction: Intravenous  Airway Management Planned: LMA  Additional Equipment:   Intra-op Plan:   Post-operative Plan: Extubation in OR  Informed Consent: I have reviewed the patients History and Physical, chart, labs and discussed the procedure including the risks, benefits and alternatives for the proposed anesthesia with the patient or authorized representative who has indicated his/her understanding and acceptance.   Dental advisory given  Plan Discussed with: CRNA  Anesthesia Plan Comments:         Anesthesia Quick Evaluation

## 2013-05-27 NOTE — Interval H&P Note (Signed)
History and Physical Interval Note:  05/27/2013 10:50 AM  Donna [name]  has presented today for surgery, with the diagnosis of Bladder Cancer  The various methods of treatment have been discussed with the patient and family. After consideration of risks, benefits and other options for treatment, the patient has consented to  Procedure(s) with comments: [place] (Bilateral) - POSSIBLE LEFT URETERAL STENT TRANSURETHRAL RESECTION OF BLADDER TUMOR (TURBT) (N/A) as a surgical intervention .  The patient's history has been reviewed, patient examined, no change in status, stable for surgery.  I have reviewed the patient's chart and labs.  Questions were answered to the patient's satisfaction.     Anette Barra,LES

## 2013-05-27 NOTE — Progress Notes (Signed)
Spoke to Dr.Borden about pt n/v. Pt vss, afebrile, pt able to void several times.  Pain controlled.  MD states he will call antiemetic in to pt's pharmacy.  Pt and pt's husband informed of this and they voiced understanding.

## 2013-05-27 NOTE — Progress Notes (Signed)
Report to Lindsay

## 2013-05-28 ENCOUNTER — Encounter (HOSPITAL_COMMUNITY): Payer: Self-pay | Admitting: Urology

## 2013-05-28 NOTE — Anesthesia Postprocedure Evaluation (Signed)
  Anesthesia Post-op Note  Patient: Donna [name]  Procedure(s) Performed: Procedure(s) (LRB): CYSTOSCOPY WITH RETROGRADE PYELOGRAM/URETERAL STENT PLACEMENT LEFT (Bilateral) TRANSURETHRAL RESECTION OF BLADDER TUMOR (TURBT) (N/A)  Patient Location: PACU  Anesthesia Type: General  Level of Consciousness: awake and alert   Airway and Oxygen Therapy: Patient Spontanous Breathing  Post-op Pain: mild  Post-op Assessment: Post-op Vital signs reviewed, Patient's Cardiovascular Status Stable, Respiratory Function Stable, Patent Airway and No signs of Nausea or vomiting  Last Vitals:  Filed Vitals:   05/27/13 1532  BP: 140/79  Pulse: 74  Temp: 36.3 C  Resp: 16    Post-op Vital Signs: stable   Complications: No apparent anesthesia complications

## 2013-08-05 ENCOUNTER — Encounter: Payer: Self-pay | Admitting: Internal Medicine

## 2013-08-19 ENCOUNTER — Other Ambulatory Visit: Payer: Self-pay

## 2013-08-19 DIAGNOSIS — Z1231 Encounter for screening mammogram for malignant neoplasm of breast: Secondary | ICD-10-CM

## 2013-09-25 ENCOUNTER — Ambulatory Visit
Admission: RE | Admit: 2013-09-25 | Discharge: 2013-09-25 | Disposition: A | Discharge status: Home / Self Care | Payer: BC Managed Care – PPO | Source: Ambulatory Visit

## 2013-09-25 ENCOUNTER — Encounter (INDEPENDENT_AMBULATORY_CARE_PROVIDER_SITE_OTHER): Payer: Self-pay

## 2013-09-25 DIAGNOSIS — Z1231 Encounter for screening mammogram for malignant neoplasm of breast: Secondary | ICD-10-CM

## 2013-09-30 ENCOUNTER — Encounter: Payer: BC Managed Care – PPO | Admitting: Gynecology

## 2013-10-10 ENCOUNTER — Other Ambulatory Visit (HOSPITAL_COMMUNITY)
Admission: RE | Admit: 2013-10-10 | Discharge: 2013-10-10 | Disposition: A | Discharge status: Home / Self Care | Payer: BC Managed Care – PPO | Source: Ambulatory Visit | Attending: Gynecology | Admitting: Gynecology

## 2013-10-10 ENCOUNTER — Ambulatory Visit (INDEPENDENT_AMBULATORY_CARE_PROVIDER_SITE_OTHER): Payer: BC Managed Care – PPO | Admitting: Gynecology

## 2013-10-10 ENCOUNTER — Encounter: Payer: Self-pay | Admitting: Gynecology

## 2013-10-10 VITALS — BP 120/66 | Ht 67.0 in | Wt 164.0 lb

## 2013-10-10 DIAGNOSIS — Z01419 Encounter for gynecological examination (general) (routine) without abnormal findings: Secondary | ICD-10-CM | POA: Insufficient documentation

## 2013-10-10 DIAGNOSIS — Z1151 Encounter for screening for human papillomavirus (HPV): Secondary | ICD-10-CM | POA: Insufficient documentation

## 2013-10-10 DIAGNOSIS — N952 Postmenopausal atrophic vaginitis: Secondary | ICD-10-CM

## 2013-10-10 NOTE — Patient Instructions (Signed)
Followup in one year for annual exam, sooner as needed.  You may obtain a copy of any labs that were done today by logging onto MyChart as outlined in the instructions provided with your AVS (after visit summary). The office will not call with normal lab results but certainly if there are any significant abnormalities then we will contact you.   Health Maintenance, Female A healthy lifestyle and preventative care can promote health and wellness.  Maintain regular health, dental, and eye exams.  Eat a healthy diet. Foods like vegetables, fruits, whole grains, low-fat dairy products, and lean protein foods contain the nutrients you need without too many calories. Decrease your intake of foods high in solid fats, added sugars, and salt. Get information about a proper diet from your caregiver, if necessary.  Regular physical exercise is one of the most important things you can do for your health. Most adults should get at least 150 minutes of moderate-intensity exercise (any activity that increases your heart rate and causes you to sweat) each week. In addition, most adults need muscle-strengthening exercises on 2 or more days a week.   Maintain a healthy weight. The body mass index (BMI) is a screening tool to identify possible weight problems. It provides an estimate of body fat based on height and weight. Your caregiver can help determine your BMI, and can help you achieve or maintain a healthy weight. For adults 20 years and older:  A BMI below 18.5 is considered underweight.  A BMI of 18.5 to 24.9 is normal.  A BMI of 25 to 29.9 is considered overweight.  A BMI of 30 and above is considered obese.  Maintain normal blood lipids and cholesterol by exercising and minimizing your intake of saturated fat. Eat a balanced diet with plenty of fruits and vegetables. Blood tests for lipids and cholesterol should begin at age 28 and be repeated every 5 years. If your lipid or cholesterol levels are  high, you are over 50, or you are a high risk for heart disease, you may need your cholesterol levels checked more frequently.Ongoing high lipid and cholesterol levels should be treated with medicines if diet and exercise are not effective.  If you smoke, find out from your caregiver how to quit. If you do not use tobacco, do not start.  Lung cancer screening is recommended for adults aged 25 80 years who are at high risk for developing lung cancer because of a history of smoking. Yearly low-dose computed tomography (CT) is recommended for people who have at least a 30-pack-year history of smoking and are a current smoker or have quit within the past 15 years. A pack year of smoking is smoking an average of 1 pack of cigarettes a day for 1 year (for example: 1 pack a day for 30 years or 2 packs a day for 15 years). Yearly screening should continue until the smoker has stopped smoking for at least 15 years. Yearly screening should also be stopped for people who develop a health problem that would prevent them from having lung cancer treatment.  If you are pregnant, do not drink alcohol. If you are breastfeeding, be very cautious about drinking alcohol. If you are not pregnant and choose to drink alcohol, do not exceed 1 drink per day. One drink is considered to be 12 ounces (355 mL) of beer, 5 ounces (148 mL) of wine, or 1.5 ounces (44 mL) of liquor.  Avoid use of street drugs. Do not share needles with anyone.  Ask for help if you need support or instructions about stopping the use of drugs.  High blood pressure causes heart disease and increases the risk of stroke. Blood pressure should be checked at least every 1 to 2 years. Ongoing high blood pressure should be treated with medicines, if weight loss and exercise are not effective.  If you are 88 to 67 years old, ask your caregiver if you should take aspirin to prevent strokes.  Diabetes screening involves taking a blood sample to check your fasting  blood sugar level. This should be done once every 3 years, after age 77, if you are within normal weight and without risk factors for diabetes. Testing should be considered at a younger age or be carried out more frequently if you are overweight and have at least 1 risk factor for diabetes.  Breast cancer screening is essential preventative care for women. You should practice "breast self-awareness." This means understanding the normal appearance and feel of your breasts and may include breast self-examination. Any changes detected, no matter how small, should be reported to a caregiver. Women in their 26s and 30s should have a clinical breast exam (CBE) by a caregiver as part of a regular health exam every 1 to 3 years. After age 32, women should have a CBE every year. Starting at age 34, women should consider having a mammogram (breast X-ray) every year. Women who have a family history of breast cancer should talk to their caregiver about genetic screening. Women at a high risk of breast cancer should talk to their caregiver about having an MRI and a mammogram every year.  Breast cancer gene (BRCA)-related cancer risk assessment is recommended for women who have family members with BRCA-related cancers. BRCA-related cancers include breast, ovarian, tubal, and peritoneal cancers. Having family members with these cancers may be associated with an increased risk for harmful changes (mutations) in the breast cancer genes BRCA1 and BRCA2. Results of the assessment will determine the need for genetic counseling and BRCA1 and BRCA2 testing.  The Pap test is a screening test for cervical cancer. Women should have a Pap test starting at age 28. Between ages 48 and 67, Pap tests should be repeated every 2 years. Beginning at age 20, you should have a Pap test every 3 years as long as the past 3 Pap tests have been normal. If you had a hysterectomy for a problem that was not cancer or a condition that could lead to  cancer, then you no longer need Pap tests. If you are between ages 55 and 63, and you have had normal Pap tests going back 10 years, you no longer need Pap tests. If you have had past treatment for cervical cancer or a condition that could lead to cancer, you need Pap tests and screening for cancer for at least 20 years after your treatment. If Pap tests have been discontinued, risk factors (such as a new sexual partner) need to be reassessed to determine if screening should be resumed. Some women have medical problems that increase the chance of getting cervical cancer. In these cases, your caregiver may recommend more frequent screening and Pap tests.  The human papillomavirus (HPV) test is an additional test that may be used for cervical cancer screening. The HPV test looks for the virus that can cause the cell changes on the cervix. The cells collected during the Pap test can be tested for HPV. The HPV test could be used to screen women aged 28 years  and older, and should be used in women of any age who have unclear Pap test results. After the age of 85, women should have HPV testing at the same frequency as a Pap test.  Colorectal cancer can be detected and often prevented. Most routine colorectal cancer screening begins at the age of 61 and continues through age 55. However, your caregiver may recommend screening at an earlier age if you have risk factors for colon cancer. On a yearly basis, your caregiver may provide home test kits to check for hidden blood in the stool. Use of a small camera at the end of a tube, to directly examine the colon (sigmoidoscopy or colonoscopy), can detect the earliest forms of colorectal cancer. Talk to your caregiver about this at age 56, when routine screening begins. Direct examination of the colon should be repeated every 5 to 10 years through age 20, unless early forms of pre-cancerous polyps or small growths are found.  Hepatitis C blood testing is recommended for  all people born from [birth year] through 1965 and any individual with known risks for hepatitis C.  Practice safe sex. Use condoms and avoid high-risk sexual practices to reduce the spread of sexually transmitted infections (STIs). Sexually active women aged 68 and younger should be checked for Chlamydia, which is a common sexually transmitted infection. Older women with new or multiple partners should also be tested for Chlamydia. Testing for other STIs is recommended if you are sexually active and at increased risk.  Osteoporosis is a disease in which the bones lose minerals and strength with aging. This can result in serious bone fractures. The risk of osteoporosis can be identified using a bone density scan. Women ages 42 and over and women at risk for fractures or osteoporosis should discuss screening with their caregivers. Ask your caregiver whether you should be taking a calcium supplement or vitamin D to reduce the rate of osteoporosis.  Menopause can be associated with physical symptoms and risks. Hormone replacement therapy is available to decrease symptoms and risks. You should talk to your caregiver about whether hormone replacement therapy is right for you.  Use sunscreen. Apply sunscreen liberally and repeatedly throughout the day. You should seek shade when your shadow is shorter than you. Protect yourself by wearing long sleeves, pants, a wide-brimmed hat, and sunglasses year round, whenever you are outdoors.  Notify your caregiver of new moles or changes in moles, especially if there is a change in shape or color. Also notify your caregiver if a mole is larger than the size of a pencil eraser.  Stay current with your immunizations. Document Released: 09/06/2010 Document Revised: 06/18/2012 Document Reviewed: 09/06/2010 [facility] Patient Information 2014 [place].

## 2013-10-10 NOTE — Addendum Note (Signed)
Addended by: Nelva Nay on: 10/10/2013 11:57 AM   Modules accepted: Orders

## 2013-10-10 NOTE — Progress Notes (Signed)
Donna [name] 1946/02/06 [id number]        68 y.o.  G3P3003 for annual exam.  Several issues noted below.  Past medical history,surgical history, problem list, medications, allergies, family history and social history were all reviewed and documented as reviewed in the EPIC chart.  ROS:  12 system ROS performed with pertinent positives and negatives included in the history, assessment and plan.   Additional significant findings :  None   Exam: Kim Counsellor Vitals:   10/10/13 1133  BP: 120/66  Height: 5\' 7"  (1.702 m)  Weight: 164 lb (74.39 kg)   General appearance:  Normal affect, orientation and appearance. Skin: Grossly normal HEENT: Without gross lesions.  No cervical or supraclavicular adenopathy. Thyroid normal.  Lungs:  Clear without wheezing, rales or rhonchi Cardiac: RR, without RMG Abdominal:  Soft, nontender, without masses, guarding, rebound, organomegaly or hernia Breasts:  Examined lying and sitting without masses, retractions, discharge or axillary adenopathy. Pelvic:  Ext/BUS/vagina with atrophic changes  Cervix with atrophic changes. Pap  Uterus anteverted, normal size, shape and contour, midline and mobile nontender   Adnexa  Without masses or tenderness    Anus and perineum  Normal   Rectovaginal  Normal sphincter tone without palpated masses or tenderness.    Assessment/Plan:  68 y.o. G3P3003 female for annual exam.   1. Postmenopausal/atrophic genital changes. Patient doing well without significant hot flushes, night sweats, vaginal dryness or dyspareunia. No vaginal bleeding. Will continue to monitor. Call if any vaginal bleeding. 2. Bladder cancer. Patient actively being followed by [name]. Continue followup with him in reference to this. 3. Pap smear 2012. Pap done today. No history of significant abnormal Pap smears. 4. Mammography 09/2013. Continue with annual mammography. SBE monthly reviewed. 5. DEXA reported 2011. I do not have a copy of  this as she reports it was normal. She'll continue to followup with [name] in reference to this. Increase calcium and vitamin D reviewed. 6. Colonoscopy 2012 with reported repeat interval 5 years. 7. Health maintenance. The routine lab work done as she reports this done through her primary physician's office. Followup in one year, sooner as needed.   Note: This document was prepared with digital dictation and possible smart phrase technology. Any transcriptional errors that result from this process are unintentional.   [name], 11:52 AM 10/10/2013

## 2013-10-11 LAB — CYTOLOGY - PAP

## 2013-11-01 ENCOUNTER — Encounter: Payer: Self-pay | Admitting: Internal Medicine

## 2014-01-06 ENCOUNTER — Encounter: Payer: Self-pay | Admitting: Gynecology

## 2014-08-21 ENCOUNTER — Other Ambulatory Visit: Payer: Self-pay

## 2014-08-21 DIAGNOSIS — Z1231 Encounter for screening mammogram for malignant neoplasm of breast: Secondary | ICD-10-CM

## 2014-09-29 ENCOUNTER — Ambulatory Visit
Admission: RE | Admit: 2014-09-29 | Discharge: 2014-09-29 | Disposition: A | Discharge status: Home / Self Care | Payer: Medicare Other | Source: Ambulatory Visit

## 2014-09-29 DIAGNOSIS — Z1231 Encounter for screening mammogram for malignant neoplasm of breast: Secondary | ICD-10-CM

## 2014-10-16 ENCOUNTER — Ambulatory Visit (INDEPENDENT_AMBULATORY_CARE_PROVIDER_SITE_OTHER): Payer: Medicare Other | Admitting: Gynecology

## 2014-10-16 ENCOUNTER — Encounter: Payer: Self-pay | Admitting: Gynecology

## 2014-10-16 VITALS — BP 120/74 | Ht 68.0 in | Wt 163.0 lb

## 2014-10-16 DIAGNOSIS — N952 Postmenopausal atrophic vaginitis: Secondary | ICD-10-CM | POA: Diagnosis not present

## 2014-10-16 DIAGNOSIS — Z01419 Encounter for gynecological examination (general) (routine) without abnormal findings: Secondary | ICD-10-CM

## 2014-10-16 NOTE — Patient Instructions (Signed)
You may obtain a copy of any labs that were done today by logging onto MyChart as outlined in the instructions provided with your AVS (after visit summary). The office will not call with normal lab results but certainly if there are any significant abnormalities then we will contact you.   Health Maintenance, Female A healthy lifestyle and preventative care can promote health and wellness.  Maintain regular health, dental, and eye exams.  Eat a healthy diet. Foods like vegetables, fruits, whole grains, low-fat dairy products, and lean protein foods contain the nutrients you need without too many calories. Decrease your intake of foods high in solid fats, added sugars, and salt. Get information about a proper diet from your caregiver, if necessary.  Regular physical exercise is one of the most important things you can do for your health. Most adults should get at least 150 minutes of moderate-intensity exercise (any activity that increases your heart rate and causes you to sweat) each week. In addition, most adults need muscle-strengthening exercises on 2 or more days a week.   Maintain a healthy weight. The body mass index (BMI) is a screening tool to identify possible weight problems. It provides an estimate of body fat based on height and weight. Your caregiver can help determine your BMI, and can help you achieve or maintain a healthy weight. For adults 20 years and older:  A BMI below 18.5 is considered underweight.  A BMI of 18.5 to 24.9 is normal.  A BMI of 25 to 29.9 is considered overweight.  A BMI of 30 and above is considered obese.  Maintain normal blood lipids and cholesterol by exercising and minimizing your intake of saturated fat. Eat a balanced diet with plenty of fruits and vegetables. Blood tests for lipids and cholesterol should begin at age 61 and be repeated every 5 years. If your lipid or cholesterol levels are high, you are over 50, or you are a high risk for heart  disease, you may need your cholesterol levels checked more frequently.Ongoing high lipid and cholesterol levels should be treated with medicines if diet and exercise are not effective.  If you smoke, find out from your caregiver how to quit. If you do not use tobacco, do not start.  Lung cancer screening is recommended for adults aged 33 80 years who are at high risk for developing lung cancer because of a history of smoking. Yearly low-dose computed tomography (CT) is recommended for people who have at least a 30-pack-year history of smoking and are a current smoker or have quit within the past 15 years. A pack year of smoking is smoking an average of 1 pack of cigarettes a day for 1 year (for example: 1 pack a day for 30 years or 2 packs a day for 15 years). Yearly screening should continue until the smoker has stopped smoking for at least 15 years. Yearly screening should also be stopped for people who develop a health problem that would prevent them from having lung cancer treatment.  If you are pregnant, do not drink alcohol. If you are breastfeeding, be very cautious about drinking alcohol. If you are not pregnant and choose to drink alcohol, do not exceed 1 drink per day. One drink is considered to be 12 ounces (355 mL) of beer, 5 ounces (148 mL) of wine, or 1.5 ounces (44 mL) of liquor.  Avoid use of street drugs. Do not share needles with anyone. Ask for help if you need support or instructions about stopping  the use of drugs.  High blood pressure causes heart disease and increases the risk of stroke. Blood pressure should be checked at least every 1 to 2 years. Ongoing high blood pressure should be treated with medicines, if weight loss and exercise are not effective.  If you are 59 to 68 years old, ask your caregiver if you should take aspirin to prevent strokes.  Diabetes screening involves taking a blood sample to check your fasting blood sugar level. This should be done once every 3  years, after age 91, if you are within normal weight and without risk factors for diabetes. Testing should be considered at a younger age or be carried out more frequently if you are overweight and have at least 1 risk factor for diabetes.  Breast cancer screening is essential preventative care for women. You should practice "breast self-awareness." This means understanding the normal appearance and feel of your breasts and may include breast self-examination. Any changes detected, no matter how small, should be reported to a caregiver. Women in their 66s and 30s should have a clinical breast exam (CBE) by a caregiver as part of a regular health exam every 1 to 3 years. After age 101, women should have a CBE every year. Starting at age 100, women should consider having a mammogram (breast X-ray) every year. Women who have a family history of breast cancer should talk to their caregiver about genetic screening. Women at a high risk of breast cancer should talk to their caregiver about having an MRI and a mammogram every year.  Breast cancer gene (BRCA)-related cancer risk assessment is recommended for women who have family members with BRCA-related cancers. BRCA-related cancers include breast, ovarian, tubal, and peritoneal cancers. Having family members with these cancers may be associated with an increased risk for harmful changes (mutations) in the breast cancer genes BRCA1 and BRCA2. Results of the assessment will determine the need for genetic counseling and BRCA1 and BRCA2 testing.  The Pap test is a screening test for cervical cancer. Women should have a Pap test starting at age 57. Between ages 25 and 35, Pap tests should be repeated every 2 years. Beginning at age 37, you should have a Pap test every 3 years as long as the past 3 Pap tests have been normal. If you had a hysterectomy for a problem that was not cancer or a condition that could lead to cancer, then you no longer need Pap tests. If you are  between ages 50 and 76, and you have had normal Pap tests going back 10 years, you no longer need Pap tests. If you have had past treatment for cervical cancer or a condition that could lead to cancer, you need Pap tests and screening for cancer for at least 20 years after your treatment. If Pap tests have been discontinued, risk factors (such as a new sexual partner) need to be reassessed to determine if screening should be resumed. Some women have medical problems that increase the chance of getting cervical cancer. In these cases, your caregiver may recommend more frequent screening and Pap tests.  The human papillomavirus (HPV) test is an additional test that may be used for cervical cancer screening. The HPV test looks for the virus that can cause the cell changes on the cervix. The cells collected during the Pap test can be tested for HPV. The HPV test could be used to screen women aged 44 years and older, and should be used in women of any age  who have unclear Pap test results. After the age of 55, women should have HPV testing at the same frequency as a Pap test.  Colorectal cancer can be detected and often prevented. Most routine colorectal cancer screening begins at the age of 44 and continues through age 20. However, your caregiver may recommend screening at an earlier age if you have risk factors for colon cancer. On a yearly basis, your caregiver may provide home test kits to check for hidden blood in the stool. Use of a small camera at the end of a tube, to directly examine the colon (sigmoidoscopy or colonoscopy), can detect the earliest forms of colorectal cancer. Talk to your caregiver about this at age 86, when routine screening begins. Direct examination of the colon should be repeated every 5 to 10 years through age 13, unless early forms of pre-cancerous polyps or small growths are found.  Hepatitis C blood testing is recommended for all people born from [birth year] through 1964 and any  individual with known risks for hepatitis C.  Practice safe sex. Use condoms and avoid high-risk sexual practices to reduce the spread of sexually transmitted infections (STIs). Sexually active women aged 36 and younger should be checked for Chlamydia, which is a common sexually transmitted infection. Older women with new or multiple partners should also be tested for Chlamydia. Testing for other STIs is recommended if you are sexually active and at increased risk.  Osteoporosis is a disease in which the bones lose minerals and strength with aging. This can result in serious bone fractures. The risk of osteoporosis can be identified using a bone density scan. Women ages 20 and over and women at risk for fractures or osteoporosis should discuss screening with their caregivers. Ask your caregiver whether you should be taking a calcium supplement or vitamin D to reduce the rate of osteoporosis.  Menopause can be associated with physical symptoms and risks. Hormone replacement therapy is available to decrease symptoms and risks. You should talk to your caregiver about whether hormone replacement therapy is right for you.  Use sunscreen. Apply sunscreen liberally and repeatedly throughout the day. You should seek shade when your shadow is shorter than you. Protect yourself by wearing long sleeves, pants, a wide-brimmed hat, and sunglasses year round, whenever you are outdoors.  Notify your caregiver of new moles or changes in moles, especially if there is a change in shape or color. Also notify your caregiver if a mole is larger than the size of a pencil eraser.  Stay current with your immunizations. Document Released: 09/06/2010 Document Revised: 06/18/2012 Document Reviewed: 09/06/2010 [facility] Patient Information 2014 [place].

## 2014-10-16 NOTE — Progress Notes (Signed)
Donna [name] Oct 31, 1945 [id number]        69 y.o.  G3P3003 for breast and pelvic exam.  Past medical history,surgical history, problem list, medications, allergies, family history and social history were all reviewed and documented as reviewed in the EPIC chart.  ROS:  Performed with pertinent positives and negatives included in the history, assessment and plan.   Additional significant findings :  none   Exam: Kim Counsellor Vitals:   10/16/14 1150  BP: 120/74  Height: 5\' 8"  (1.727 m)  Weight: 163 lb (73.936 kg)   General appearance:  Normal affect, orientation and appearance. Skin: Grossly normal HEENT: Without gross lesions.  No cervical or supraclavicular adenopathy. Thyroid normal.  Lungs:  Clear without wheezing, rales or rhonchi Cardiac: RR, without RMG Abdominal:  Soft, nontender, without masses, guarding, rebound, organomegaly or hernia Breasts:  Examined lying and sitting without masses, retractions, discharge or axillary adenopathy. Pelvic:  Ext/BUS/vagina with atrophic changes  Cervix with atrophic changes  Uterus anteverted, normal size, shape and contour, midline and mobile nontender   Adnexa  Without masses or tenderness    Anus and perineum  Normal   Rectovaginal  Normal sphincter tone without palpated masses or tenderness.    Assessment/Plan:  69 y.o. G21P3003 female for breast and pelvic exam.   1. Postmenopausal/atrophic genital changes. No significant hot flushes, night sweats, vaginal dryness or any vaginal bleeding. Continue to monitor and report any issues or bleeding. 2. Bladder cancer. Actively followed by [name] and has cystoscopy be scheduled. 3. Pap smear/HPV negative 2015. No Pap smear done today.  No history of abnormal Pap smears. 4. Mammography 09/2014. Continue with annual mammography. SBE monthly reviewed. 5. Reports DEXA 2011. Believes that she is to repeat it this coming years, follow up with Dr. Osborne Casco as far as this is concerned.  Increased calcium vitamin D reviewed. 6. Colonoscopy 2012. Repeat at their recommended interval which will be next year by her history. 7. Health maintenance. No routine blood work done as she reports this done through her primary physician's office. Follow up in one year, sooner as needed   [name], 12:22 PM 10/16/2014

## 2015-04-15 DIAGNOSIS — Z1212 Encounter for screening for malignant neoplasm of rectum: Secondary | ICD-10-CM | POA: Diagnosis not present

## 2015-04-29 DIAGNOSIS — Z8551 Personal history of malignant neoplasm of bladder: Secondary | ICD-10-CM | POA: Diagnosis not present

## 2015-09-18 ENCOUNTER — Other Ambulatory Visit: Payer: Self-pay | Admitting: Gynecology

## 2015-09-18 DIAGNOSIS — Z1231 Encounter for screening mammogram for malignant neoplasm of breast: Secondary | ICD-10-CM

## 2015-10-01 ENCOUNTER — Ambulatory Visit
Admission: RE | Admit: 2015-10-01 | Discharge: 2015-10-01 | Disposition: A | Discharge status: Home / Self Care | Payer: Medicare Other | Source: Ambulatory Visit | Attending: Gynecology | Admitting: Gynecology

## 2015-10-01 DIAGNOSIS — Z1231 Encounter for screening mammogram for malignant neoplasm of breast: Secondary | ICD-10-CM

## 2015-10-05 ENCOUNTER — Encounter: Payer: Self-pay | Admitting: Gastroenterology

## 2015-10-19 ENCOUNTER — Encounter: Payer: Self-pay | Admitting: Gynecology

## 2015-10-19 ENCOUNTER — Ambulatory Visit (INDEPENDENT_AMBULATORY_CARE_PROVIDER_SITE_OTHER): Payer: Medicare Other | Admitting: Gynecology

## 2015-10-19 VITALS — BP 118/76 | Ht 67.0 in | Wt 161.0 lb

## 2015-10-19 DIAGNOSIS — Z01419 Encounter for gynecological examination (general) (routine) without abnormal findings: Secondary | ICD-10-CM | POA: Diagnosis not present

## 2015-10-19 DIAGNOSIS — N952 Postmenopausal atrophic vaginitis: Secondary | ICD-10-CM

## 2015-10-19 NOTE — Patient Instructions (Signed)

## 2015-10-19 NOTE — Progress Notes (Signed)
    Donna [name] 1945/04/23 [id number]        70 y.o.  G3P3003  for breast and pelvic exam  Past medical history,surgical history, problem list, medications, allergies, family history and social history were all reviewed and documented as reviewed in the EPIC chart.  ROS:  Performed with pertinent positives and negatives included in the history, assessment and plan.   Additional significant findings :  None   Exam: [name] assistant Vitals:   10/19/15 1116  BP: 118/76  Weight: 161 lb (73 kg)  Height: 5\' 7"  (1.702 m)   Body mass index is 25.22 kg/m.  General appearance:  Normal affect, orientation and appearance. Skin: Grossly normal HEENT: Without gross lesions.  No cervical or supraclavicular adenopathy. Thyroid normal.  Lungs:  Clear without wheezing, rales or rhonchi Cardiac: RR, without RMG Abdominal:  Soft, nontender, without masses, guarding, rebound, organomegaly or hernia Breasts:  Examined lying and sitting without masses, retractions, discharge or axillary adenopathy. Pelvic:  Ext/BUS/Vagina with atrophic changes  Cervix with atrophic changes  Uterus anteverted, normal size, shape and contour, midline and mobile nontender   Adnexa without masses or tenderness    Anus and perineum normal   Rectovaginal normal sphincter tone without palpated masses or tenderness.    Assessment/Plan:  70 y.o. G70P3003 female for breast and pelvic exam.   1. Postmenopausal/atrophic genital changes. No significant hot flushes, night sweats, vaginal dryness or any vaginal bleeding. Continue to monitor report any issues or bleeding. 2. Pap smear/HPV 2015. No Pap smear done today. No history of abnormal Pap smears. Reviewed current screening guidelines. Options to stop screening altogether based on age versus less frequent screening intervals reviewed. Will readdress on annual basis. 3. Mammography 09/2015. Continue with annual mammography when due. SBE monthly reviewed. 4. DEXA 2011.  She's followed through her primary physician's office and will continue to be monitored by them. 5. Colonoscopy 2012. Repeat at their recommended interval. 6. Health maintenance. No routine lab work done as patient reports is done elsewhere. Actively followed by [name] for her history of bladder cancer. Follow up in one year, sooner as needed.    [name], 11:45 AM 10/19/2015

## 2015-10-26 ENCOUNTER — Encounter: Payer: Self-pay | Admitting: Gastroenterology

## 2015-12-02 DIAGNOSIS — C678 Malignant neoplasm of overlapping sites of bladder: Secondary | ICD-10-CM | POA: Diagnosis not present

## 2015-12-15 ENCOUNTER — Ambulatory Visit (AMBULATORY_SURGERY_CENTER): Payer: Self-pay | Admitting: *Deleted

## 2015-12-15 VITALS — Ht 67.5 in | Wt 165.0 lb

## 2015-12-15 DIAGNOSIS — Z1211 Encounter for screening for malignant neoplasm of colon: Secondary | ICD-10-CM

## 2015-12-15 DIAGNOSIS — Z86010 Personal history of colonic polyps: Secondary | ICD-10-CM

## 2015-12-15 MED ORDER — NA SULFATE-K SULFATE-MG SULF 17.5-3.13-1.6 GM/177ML PO SOLN: 1.0000 | Freq: Once | ORAL | 0 refills | Status: AC

## 2015-12-15 NOTE — Progress Notes (Signed)
No egg or soy allergy known to patient  No issues with past sedation with any surgeries  or procedures, no intubation problems  No diet pills per patient No home 02 use per patient  No blood thinners per patient  Pt denies issues with constipation  Hx of A fib 10 years ago but none since  And NO  A flutter

## 2015-12-16 ENCOUNTER — Encounter: Payer: Self-pay | Admitting: Gastroenterology

## 2015-12-19 DIAGNOSIS — Z23 Encounter for immunization: Secondary | ICD-10-CM | POA: Diagnosis not present

## 2015-12-29 ENCOUNTER — Ambulatory Visit (AMBULATORY_SURGERY_CENTER): Payer: Medicare Other | Admitting: Gastroenterology

## 2015-12-29 ENCOUNTER — Encounter: Payer: Self-pay | Admitting: Gastroenterology

## 2015-12-29 VITALS — BP 105/61 | HR 85 | Temp 97.1°F | Resp 12 | Ht 67.0 in | Wt 165.0 lb

## 2015-12-29 DIAGNOSIS — Z86010 Personal history of colonic polyps: Secondary | ICD-10-CM | POA: Diagnosis present

## 2015-12-29 MED ORDER — SODIUM CHLORIDE 0.9 % IV SOLN: 500.0000 mL | INTRAVENOUS | Status: DC

## 2015-12-29 NOTE — Progress Notes (Signed)
Report to PACU, RN, vss, BBS= Clear.  

## 2015-12-29 NOTE — Op Note (Addendum)
[facility] Patient Name: Donna [name] Procedure Date: 12/29/2015 1:47 PM MRN: [id number] Endoscopist: Mauri Pole , MD Age: 70 Referring MD:  Date of Birth: 12/20/1945 Gender: Female Account #: [id number] Procedure:                Colonoscopy Indications:              Surveillance: Personal history of adenomatous                            polyps on last colonoscopy 5 years ago, High risk                            colon cancer surveillance: Personal history of                            adenoma less than 10 mm in size Medicines:                Monitored Anesthesia Care Procedure:                Pre-Anesthesia Assessment:                           - Prior to the procedure, a History and Physical                            was performed, and patient medications and                            allergies were reviewed. The patient's tolerance of                            previous anesthesia was also reviewed. The risks                            and benefits of the procedure and the sedation                            options and risks were discussed with the patient.                            All questions were answered, and informed consent                            was obtained. Prior Anticoagulants: The patient has                            taken no previous anticoagulant or antiplatelet                            agents. ASA Grade Assessment: II - A patient with                            mild systemic disease. After reviewing the risks  and benefits, the patient was deemed in                            satisfactory condition to undergo the procedure.                           After obtaining informed consent, the colonoscope                            was passed under direct vision. Throughout the                            procedure, the patient's blood pressure, pulse, and                            oxygen saturations were  monitored continuously. The                            Model CF-HQ190L [id number]) scope was introduced                            through the anus and advanced to the the terminal                            ileum, with identification of the appendiceal                            orifice and IC valve. The colonoscopy was performed                            without difficulty. The patient tolerated the                            procedure well. The quality of the bowel                            preparation was excellent. The terminal ileum,                            ileocecal valve, appendiceal orifice, and rectum                            were photographed. The quality of the bowel                            preparation was evaluated using the BBPS [facility]                            Bowel Preparation Scale) with scores of: Right                            Colon = 3, Transverse Colon = 3 and Left Colon = 3                            (  entire mucosa seen well with no residual staining,                            small fragments of stool or opaque liquid). The                            total BBPS score equals 9. Scope In: 1:51:02 PM Scope Out: 2:08:05 PM Scope Withdrawal Time: 0 hours 11 minutes 58 seconds  Total Procedure Duration: 0 hours 17 minutes 3 seconds  Findings:                 The perianal and digital rectal examinations were                            normal.                           A few small-mouthed diverticula were found in the                            descending colon and ascending colon.                           Non-bleeding internal hemorrhoids were found during                            retroflexion. The hemorrhoids were small.                           The exam was otherwise without abnormality. Complications:            No immediate complications. Estimated Blood Loss:     Estimated blood loss: none. Impression:               - Diverticulosis in the  descending colon and in the                            ascending colon.                           - Non-bleeding internal hemorrhoids.                           - The examination was otherwise normal.                           - No specimens collected. Recommendation:           - Patient has a contact number available for                            emergencies. The signs and symptoms of potential                            delayed complications were discussed with the  patient. Return to normal activities tomorrow.                            Written discharge instructions were provided to the                            patient.                           - Resume previous diet.                           - Continue present medications.                           - Repeat colonoscopy in 10 years for surveillance. Mauri Pole, MD 12/29/2015 2:14:24 PM This report has been signed electronically.

## 2015-12-29 NOTE — Progress Notes (Signed)
No problems noted in the recovery room. maw 

## 2015-12-29 NOTE — Patient Instructions (Addendum)
YOU HAD AN ENDOSCOPIC PROCEDURE TODAY AT [facility]:   Refer to the procedure report that was given to you for any specific questions about what was found during the examination.  If the procedure report does not answer your questions, please call your gastroenterologist to clarify.  If you requested that your care partner not be given the details of your procedure findings, then the procedure report has been included in a sealed envelope for you to review at your convenience later.  YOU SHOULD EXPECT: Some feelings of bloating in the abdomen. Passage of more gas than usual.  Walking can help get rid of the air that was put into your GI tract during the procedure and reduce the bloating. If you had a lower endoscopy (such as a colonoscopy or flexible sigmoidoscopy) you may notice spotting of blood in your stool or on the toilet paper. If you underwent a bowel prep for your procedure, you may not have a normal bowel movement for a few days.  Please Note:  You might notice some irritation and congestion in your nose or some drainage.  This is from the oxygen used during your procedure.  There is no need for concern and it should clear up in a day or so.  SYMPTOMS TO REPORT IMMEDIATELY:   Following lower endoscopy (colonoscopy or flexible sigmoidoscopy):  Excessive amounts of blood in the stool  Significant tenderness or worsening of abdominal pains  Swelling of the abdomen that is new, acute  Fever of 100F or higher   Following upper endoscopy (EGD)  Vomiting of blood or coffee ground material  New chest pain or pain under the shoulder blades  Painful or persistently difficult swallowing  New shortness of breath  Fever of 100F or higher  Black, tarry-looking stools  For urgent or emergent issues, a gastroenterologist can be reached at any hour by calling [phone].   DIET:  We do recommend a small meal at first, but then you may proceed to your regular diet.  Drink  plenty of fluids but you should avoid alcoholic beverages for 24 hours.  ACTIVITY:  You should plan to take it easy for the rest of today and you should NOT DRIVE or use heavy machinery until tomorrow (because of the sedation medicines used during the test).    FOLLOW UP: Our staff will call the number listed on your records the next business day following your procedure to check on you and address any questions or concerns that you may have regarding the information given to you following your procedure. If we do not reach you, we will leave a message.  However, if you are feeling well and you are not experiencing any problems, there is no need to return our call.  We will assume that you have returned to your regular daily activities without incident.  If any biopsies were taken you will be contacted by phone or by letter within the next 1-3 weeks.  Please call us at [phone] if you have not heard about the biopsies in 3 weeks.    SIGNATURES/CONFIDENTIALITY: You and/or your care partner have signed paperwork which will be entered into your electronic medical record.  These signatures attest to the fact that that the information above on your After Visit Summary has been reviewed and is understood.  Full responsibility of the confidentiality of this discharge information lies with you and/or your care-partner.     Handouts were given to your care partner on  diverticulosis, hemorrhoids, and a high fiber diet with liberal fluid intake. You may resume your current medications today. Repeat colonoscopy in 10 years for surveillance. Please call if any questions or concerns.

## 2015-12-30 ENCOUNTER — Telehealth: Payer: Self-pay | Admitting: *Deleted

## 2015-12-30 NOTE — Telephone Encounter (Signed)
  Follow up Call-  Call back number 12/29/2015  Post procedure Call Back phone  # [phone]  Permission to leave phone message Yes  Some recent data might be hidden     Patient questions:  Do you have a fever, pain , or abdominal swelling? No. Pain Score  0 *  Have you tolerated food without any problems? Yes.    Have you been able to return to your normal activities? Yes.    Do you have any questions about your discharge instructions: Diet   No. Medications  No. Follow up visit  No.  Do you have questions or concerns about your Care? No.  Actions: * If pain score is 4 or above: No action needed, pain <4.

## 2016-01-07 DIAGNOSIS — Z85828 Personal history of other malignant neoplasm of skin: Secondary | ICD-10-CM | POA: Diagnosis not present

## 2016-01-07 DIAGNOSIS — L821 Other seborrheic keratosis: Secondary | ICD-10-CM | POA: Diagnosis not present

## 2016-01-07 DIAGNOSIS — D225 Melanocytic nevi of trunk: Secondary | ICD-10-CM | POA: Diagnosis not present

## 2016-01-07 DIAGNOSIS — L603 Nail dystrophy: Secondary | ICD-10-CM | POA: Diagnosis not present

## 2016-01-07 DIAGNOSIS — D1801 Hemangioma of skin and subcutaneous tissue: Secondary | ICD-10-CM | POA: Diagnosis not present

## 2016-01-07 DIAGNOSIS — D2262 Melanocytic nevi of left upper limb, including shoulder: Secondary | ICD-10-CM | POA: Diagnosis not present

## 2016-01-07 DIAGNOSIS — L812 Freckles: Secondary | ICD-10-CM | POA: Diagnosis not present

## 2016-01-07 DIAGNOSIS — D2372 Other benign neoplasm of skin of left lower limb, including hip: Secondary | ICD-10-CM | POA: Diagnosis not present

## 2016-01-07 DIAGNOSIS — Z0100 Encounter for examination of eyes and vision without abnormal findings: Secondary | ICD-10-CM | POA: Diagnosis not present

## 2016-03-11 DIAGNOSIS — E7800 Pure hypercholesterolemia, unspecified: Secondary | ICD-10-CM | POA: Diagnosis not present

## 2016-03-11 DIAGNOSIS — E559 Vitamin D deficiency, unspecified: Secondary | ICD-10-CM | POA: Diagnosis not present

## 2016-03-11 DIAGNOSIS — M859 Disorder of bone density and structure, unspecified: Secondary | ICD-10-CM | POA: Diagnosis not present

## 2016-03-18 DIAGNOSIS — E7800 Pure hypercholesterolemia, unspecified: Secondary | ICD-10-CM | POA: Diagnosis not present

## 2016-03-18 DIAGNOSIS — M859 Disorder of bone density and structure, unspecified: Secondary | ICD-10-CM | POA: Diagnosis not present

## 2016-03-18 DIAGNOSIS — C679 Malignant neoplasm of bladder, unspecified: Secondary | ICD-10-CM | POA: Diagnosis not present

## 2016-03-18 DIAGNOSIS — Z0000 Encounter for general adult medical examination without abnormal findings: Secondary | ICD-10-CM | POA: Diagnosis not present

## 2016-03-18 DIAGNOSIS — Z1389 Encounter for screening for other disorder: Secondary | ICD-10-CM | POA: Diagnosis not present

## 2016-03-18 DIAGNOSIS — Z6825 Body mass index (BMI) 25.0-25.9, adult: Secondary | ICD-10-CM | POA: Diagnosis not present

## 2016-03-18 DIAGNOSIS — E559 Vitamin D deficiency, unspecified: Secondary | ICD-10-CM | POA: Diagnosis not present

## 2016-03-24 DIAGNOSIS — Z1212 Encounter for screening for malignant neoplasm of rectum: Secondary | ICD-10-CM | POA: Diagnosis not present

## 2016-06-01 DIAGNOSIS — C678 Malignant neoplasm of overlapping sites of bladder: Secondary | ICD-10-CM | POA: Diagnosis not present

## 2016-08-22 ENCOUNTER — Other Ambulatory Visit: Payer: Self-pay | Admitting: Internal Medicine

## 2016-08-22 DIAGNOSIS — Z1231 Encounter for screening mammogram for malignant neoplasm of breast: Secondary | ICD-10-CM

## 2016-09-29 DIAGNOSIS — L03031 Cellulitis of right toe: Secondary | ICD-10-CM | POA: Diagnosis not present

## 2016-10-03 ENCOUNTER — Ambulatory Visit
Admission: RE | Admit: 2016-10-03 | Discharge: 2016-10-03 | Disposition: A | Discharge status: Home / Self Care | Payer: Medicare Other | Source: Ambulatory Visit | Attending: Internal Medicine | Admitting: Internal Medicine

## 2016-10-03 DIAGNOSIS — Z1231 Encounter for screening mammogram for malignant neoplasm of breast: Secondary | ICD-10-CM

## 2016-10-05 DIAGNOSIS — L03116 Cellulitis of left lower limb: Secondary | ICD-10-CM | POA: Diagnosis not present

## 2016-10-05 DIAGNOSIS — Z6825 Body mass index (BMI) 25.0-25.9, adult: Secondary | ICD-10-CM | POA: Diagnosis not present

## 2016-10-20 ENCOUNTER — Encounter: Payer: Medicare Other | Admitting: Gynecology

## 2016-10-31 ENCOUNTER — Encounter: Payer: Self-pay | Admitting: Gynecology

## 2016-10-31 ENCOUNTER — Ambulatory Visit (INDEPENDENT_AMBULATORY_CARE_PROVIDER_SITE_OTHER): Payer: Medicare Other | Admitting: Gynecology

## 2016-10-31 VITALS — BP 118/78 | Ht 66.0 in | Wt 159.0 lb

## 2016-10-31 DIAGNOSIS — Z01411 Encounter for gynecological examination (general) (routine) with abnormal findings: Secondary | ICD-10-CM

## 2016-10-31 DIAGNOSIS — Z01419 Encounter for gynecological examination (general) (routine) without abnormal findings: Secondary | ICD-10-CM | POA: Diagnosis not present

## 2016-10-31 DIAGNOSIS — N952 Postmenopausal atrophic vaginitis: Secondary | ICD-10-CM

## 2016-10-31 NOTE — Patient Instructions (Signed)
Followup in one year for annual exam, sooner if any issues 

## 2016-10-31 NOTE — Progress Notes (Signed)
    Donna [name] Sep 14, 1945 [id number]        71 y.o.  G3P3003 for breast and pelvic exam.  Past medical history,surgical history, problem list, medications, allergies, family history and social history were all reviewed and documented as reviewed in the EPIC chart.  ROS:  Performed with pertinent positives and negatives included in the history, assessment and plan.   Additional significant findings :  None   Exam: [name] assistant Vitals:   10/31/16 0902  BP: 118/78  Weight: 159 lb (72.1 kg)  Height: 5\' 6"  (1.676 m)   Body mass index is 25.66 kg/m.  General appearance:  Normal affect, orientation and appearance. Skin: Grossly normal HEENT: Without gross lesions.  No cervical or supraclavicular adenopathy. Thyroid normal.  Lungs:  Clear without wheezing, rales or rhonchi Cardiac: RR, without RMG Abdominal:  Soft, nontender, without masses, guarding, rebound, organomegaly or hernia Breasts:  Examined lying and sitting without masses, retractions, discharge or axillary adenopathy. Pelvic:  Ext, BUS, Vagina: With atrophic changes  Cervix: With atrophic changes  Uterus: Anteverted, normal size, shape and contour, midline and mobile nontender   Adnexa: Without masses or tenderness    Anus and perineum: Normal   Rectovaginal: Normal sphincter tone without palpated masses or tenderness.    Assessment/Plan:  71 y.o. G73P3003 female for breast and pelvic exam.   1. Postmenopausal/atrophic genital changes. No significant hot flushes, night sweats, vaginal dryness or any bleeding. Continue to monitor report any issues or bleeding. 2. Pap smear/HPV 2015. No Pap smear done today. No history of abnormal Pap smears. Options to stop screening per current screening guidelines reviewed based on age. Will readdress on annual basis. 3. Mammography 09/2016. Continue with annual mammography when due. Breast exam normal today. 4. DEXA planned for this coming year and she is going to schedule  through her primary physician's office who follows her for this. 5. Colonoscopy 2017. Repeat at their recommended interval. 6. Health maintenance. No routine lab work done as patient does this elsewhere. Follow up 1 year, sooner as needed.   [name], 9:26 AM 10/31/2016

## 2017-01-12 DIAGNOSIS — H524 Presbyopia: Secondary | ICD-10-CM | POA: Diagnosis not present

## 2017-01-24 DIAGNOSIS — Z85828 Personal history of other malignant neoplasm of skin: Secondary | ICD-10-CM | POA: Diagnosis not present

## 2017-01-24 DIAGNOSIS — L812 Freckles: Secondary | ICD-10-CM | POA: Diagnosis not present

## 2017-01-24 DIAGNOSIS — L821 Other seborrheic keratosis: Secondary | ICD-10-CM | POA: Diagnosis not present

## 2017-01-24 DIAGNOSIS — L308 Other specified dermatitis: Secondary | ICD-10-CM | POA: Diagnosis not present

## 2017-02-17 DIAGNOSIS — C678 Malignant neoplasm of overlapping sites of bladder: Secondary | ICD-10-CM | POA: Diagnosis not present

## 2017-03-13 DIAGNOSIS — M859 Disorder of bone density and structure, unspecified: Secondary | ICD-10-CM | POA: Diagnosis not present

## 2017-03-13 DIAGNOSIS — R82998 Other abnormal findings in urine: Secondary | ICD-10-CM | POA: Diagnosis not present

## 2017-03-13 DIAGNOSIS — E7800 Pure hypercholesterolemia, unspecified: Secondary | ICD-10-CM | POA: Diagnosis not present

## 2017-03-20 DIAGNOSIS — Z0000 Encounter for general adult medical examination without abnormal findings: Secondary | ICD-10-CM | POA: Diagnosis not present

## 2017-03-20 DIAGNOSIS — M859 Disorder of bone density and structure, unspecified: Secondary | ICD-10-CM | POA: Diagnosis not present

## 2017-03-20 DIAGNOSIS — C679 Malignant neoplasm of bladder, unspecified: Secondary | ICD-10-CM | POA: Diagnosis not present

## 2017-03-20 DIAGNOSIS — E559 Vitamin D deficiency, unspecified: Secondary | ICD-10-CM | POA: Diagnosis not present

## 2017-03-22 ENCOUNTER — Other Ambulatory Visit: Payer: Self-pay | Admitting: Internal Medicine

## 2017-03-22 DIAGNOSIS — E7800 Pure hypercholesterolemia, unspecified: Secondary | ICD-10-CM

## 2017-03-28 ENCOUNTER — Ambulatory Visit
Admission: RE | Admit: 2017-03-28 | Discharge: 2017-03-28 | Disposition: A | Discharge status: Home / Self Care | Payer: Medicare Other | Source: Ambulatory Visit | Attending: Internal Medicine | Admitting: Internal Medicine

## 2017-03-28 DIAGNOSIS — E7800 Pure hypercholesterolemia, unspecified: Secondary | ICD-10-CM

## 2017-03-28 HISTORY — PX: OTHER SURGICAL HISTORY: SHX169

## 2017-03-31 DIAGNOSIS — Z1212 Encounter for screening for malignant neoplasm of rectum: Secondary | ICD-10-CM | POA: Diagnosis not present

## 2017-05-19 DIAGNOSIS — C678 Malignant neoplasm of overlapping sites of bladder: Secondary | ICD-10-CM | POA: Diagnosis not present

## 2017-05-29 ENCOUNTER — Other Ambulatory Visit: Payer: Self-pay | Admitting: Urology

## 2017-06-01 NOTE — Patient Instructions (Signed)
Donna [name]  06/01/2017   Your procedure is scheduled on:  04/ 01/ 2019  Report to [facility]  Entrance  Report to admitting at  2:30 PM   Call this number if you have problems the morning of surgery [phone]   Remember: Do not eat food After Midnight with exception clear liquids until 10:30 AM     CLEAR LIQUID DIET   Foods Allowed                                                                     Foods Excluded  Coffee and tea, regular and decaf                             liquids that you cannot  Plain Jell-O in any flavor                                             see through such as: Fruit ices (not with fruit pulp)                                     milk, soups, orange juice  Iced Popsicles                                    All solid food Carbonated beverages, regular and diet                                    Cranberry, grape and apple juices Sports drinks like Gatorade Lightly seasoned clear broth or consume(fat free) Sugar, honey syrup  Sample Menu Breakfast                                Lunch                                     Supper Cranberry juice                    Beef broth                            Chicken broth Jell-O                                     Grape juice                           Apple juice Coffee or tea  Jell-O                                      Popsicle                                                Coffee or tea                        Coffee or tea  _____________________________________________________________________    Take these medicines the morning of surgery with A SIP OF WATER:  ZYRTEC                                You may not have any metal on your body including hair pins and              piercings  Do not wear jewelry, make-up, lotions, powders or perfumes, deodorant             Do not wear nail polish.  Do not shave  48 hours prior to surgery.                  Do not bring valuables to the hospital. [place].  Contacts, dentures or bridgework may not be worn into surgery.  Leave suitcase in the car. After surgery it may be brought to your room.     Patients discharged the day of surgery will not be allowed to drive home.  Name and phone number of your driver:  Special Instructions: N/A              Please read over the following fact sheets you were given: _____________________________________________________________________             [facility] - Preparing for Surgery Before surgery, you can play an important role.  Because skin is not sterile, your skin needs to be as free of germs as possible.  You can reduce the number of germs on your skin by washing with CHG (chlorahexidine gluconate) soap before surgery.  CHG is an antiseptic cleaner which kills germs and bonds with the skin to continue killing germs even after washing. Please DO NOT use if you have an allergy to CHG or antibacterial soaps.  If your skin becomes reddened/irritated stop using the CHG and inform your nurse when you arrive at Short Stay. Do not shave (including legs and underarms) for at least 48 hours prior to the first CHG shower.  You may shave your face/neck. Please follow these instructions carefully:  1.  Shower with CHG Soap the night before surgery and the  morning of Surgery.  2.  If you choose to wash your hair, wash your hair first as usual with your  normal  shampoo.  3.  After you shampoo, rinse your hair and body thoroughly to remove the  shampoo.                           4.  Use CHG as you would any other liquid soap.  You can apply chg directly  to the skin and wash                       Gently with a scrungie or clean washcloth.  5.  Apply the CHG Soap to your body ONLY FROM THE NECK DOWN.   Do not use on face/ open                           Wound or open sores. Avoid contact with eyes, ears mouth and  genitals (private parts).                       Wash face,  Genitals (private parts) with your normal soap.             6.  Wash thoroughly, paying special attention to the area where your surgery  will be performed.  7.  Thoroughly rinse your body with warm water from the neck down.  8.  DO NOT shower/wash with your normal soap after using and rinsing off  the CHG Soap.                9.  Pat yourself dry with a clean towel.            10.  Wear clean pajamas.            11.  Place clean sheets on your bed the night of your first shower and do not  sleep with pets. Day of Surgery : Do not apply any lotions/deodorants the morning of surgery.  Please wear clean clothes to the hospital/surgery center.  FAILURE TO FOLLOW THESE INSTRUCTIONS MAY RESULT IN THE CANCELLATION OF YOUR SURGERY PATIENT SIGNATURE_________________________________  NURSE SIGNATURE__________________________________  ________________________________________________________________________

## 2017-06-02 ENCOUNTER — Other Ambulatory Visit: Payer: Self-pay

## 2017-06-02 ENCOUNTER — Encounter (HOSPITAL_COMMUNITY): Payer: Self-pay

## 2017-06-02 ENCOUNTER — Encounter (HOSPITAL_COMMUNITY)
Admission: RE | Admit: 2017-06-02 | Discharge: 2017-06-02 | Disposition: A | Discharge status: Home / Self Care | Payer: Medicare Other | Source: Ambulatory Visit | Attending: Urology | Admitting: Urology

## 2017-06-02 DIAGNOSIS — D090 Carcinoma in situ of bladder: Secondary | ICD-10-CM | POA: Diagnosis not present

## 2017-06-02 DIAGNOSIS — Z87891 Personal history of nicotine dependence: Secondary | ICD-10-CM | POA: Diagnosis not present

## 2017-06-02 HISTORY — DX: Personal history of colon polyps, unspecified: Z86.0100

## 2017-06-02 HISTORY — DX: Presence of spectacles and contact lenses: Z97.3

## 2017-06-02 HISTORY — DX: Malignant neoplasm of bladder, unspecified: C67.9

## 2017-06-02 HISTORY — DX: Personal history of colonic polyps: Z86.010

## 2017-06-02 HISTORY — DX: Diaphragmatic hernia without obstruction or gangrene: K44.9

## 2017-06-02 LAB — CBC
HEMATOCRIT: 42.9 % (ref 36.0–46.0)
HEMOGLOBIN: 14.1 g/dL (ref 12.0–15.0)
MCH: 29.9 pg (ref 26.0–34.0)
MCHC: 32.9 g/dL (ref 30.0–36.0)
MCV: 90.9 fL (ref 78.0–100.0)
Platelets: 283 10*3/uL (ref 150–400)
RBC: 4.72 MIL/uL (ref 3.87–5.11)
RDW: 13 % (ref 11.5–15.5)
WBC: 4.8 10*3/uL (ref 4.0–10.5)

## 2017-06-02 LAB — BASIC METABOLIC PANEL
Anion gap: 10 (ref 5–15)
BUN: 12 mg/dL (ref 6–20)
CALCIUM: 9.5 mg/dL (ref 8.9–10.3)
CO2: 25 mmol/L (ref 22–32)
Chloride: 105 mmol/L (ref 101–111)
Creatinine, Ser: 0.89 mg/dL (ref 0.44–1.00)
GLUCOSE: 100 mg/dL — AB (ref 65–99)
POTASSIUM: 4.4 mmol/L (ref 3.5–5.1)
Sodium: 140 mmol/L (ref 135–145)

## 2017-06-02 NOTE — H&P (Signed)
Office Visit Report     05/19/2017   --------------------------------------------------------------------------------   Donna [name]  MRN: [id number]  PRIMARY CARE:  Haywood Pao, MD  DOB: 28-Apr-1945, 72 year old Female  REFERRING:  Luvenia Redden  SSN: [id number]  PROVIDER:  Raynelle Bring, M.D.    LOCATION:  [facility] [phone]   --------------------------------------------------------------------------------   CC/HPI: Bladder cancer   Donna [name] is a 72 year old female who follows up today for surveillance cystoscopy of her recurrence low-grade urothelial carcinoma. At her last visit, she was noted to have some mild mucosal changes just lateral to the left ureteral orifice that were mildly suspicious but not definite for recurrence tumor. Her urine cytology was negative. Since her last visit, she denies any hematuria or new voiding complaints.     ALLERGIES: No Allergies    MEDICATIONS: No Medications     GU PSH: Bladder Instill AntiCA Agent - 2013 Cystoscopy - 02/17/2017, 06/01/2016, 12/02/2015 Cystoscopy Insert Stent - 2015 Cystoscopy TURBT <2 cm - 2015, 2013      PSH Notes: Cystoscopy With Fulguration Small Lesion (5-34mm), Cystoscopy With Insertion Of Ureteral Stent Left, Bladder Injection Of Cancer Treatment, Cystoscopy With Fulguration Small Lesion (5-62mm)   NON-GU PSH: None   GU PMH: Bladder Cancer overlapping sites, Malignant neoplasm of overlapping sites of bladder - 2017      PMH Notes:   1) Urothelial carcinoma of the bladder: She has a long history of low-grade, Ta urothelial carcinoma of the bladder.   May 2000: TURBT  Sep 2000: TURBT  Mar 2001: TURBT  Sep 2001: TURBT  Sep-Oct 2001: BCG (6 week induction)  Aug 2002: TURBT  May 2003: TURBT  May 2005: TURBT  Feb 2006: TURBT [facility] post-op)  Apr 2013: TURBT [facility] post-op) - low grade, Ta  Mar 2015: TURBT - low grade, Ta (No chemotherapy due to stent placement at time of TUR)      NON-GU PMH: None   FAMILY HISTORY: Bladder Cancer - No Family History cardiac disorder - Runs In Family malignant neoplasm of stomach - Runs In Family   SOCIAL HISTORY: Marital Status: Married Preferred Language: English; Ethnicity: Not Hispanic Or Latino; Race: White Current Smoking Status: Patient does not smoke anymore. Has not smoked since 11/05/1980.  Social Drinker.  Does not drink caffeine.     Notes: Former smoker, Death In The Family Father, Death In The Family Mother, Occupation:, Marital History - Currently Married, Alcohol Use   REVIEW OF SYSTEMS:    GU Review Female:   Patient denies frequent urination, hard to postpone urination, burning /pain with urination, get up at night to urinate, leakage of urine, stream starts and stops, trouble starting your stream, have to strain to urinate, and currently pregnant.  Gastrointestinal (Upper):   Patient denies nausea and vomiting.  Gastrointestinal (Lower):   Patient denies diarrhea and constipation.  Constitutional:   Patient denies fever, night sweats, weight loss, and fatigue.  Skin:   Patient denies skin rash/ lesion and itching.  Eyes:   Patient denies blurred vision and double vision.  Ears/ Nose/ Throat:   Patient denies sore throat and sinus problems.  Hematologic/Lymphatic:   Patient denies swollen glands and easy bruising.  Cardiovascular:   Patient denies leg swelling and chest pains.  Respiratory:   Patient reports cough. Patient denies shortness of breath.  Endocrine:   Patient denies excessive thirst.  Musculoskeletal:   Patient denies back pain and joint pain.  Neurological:  Patient denies headaches and dizziness.  Psychologic:   Patient denies anxiety and depression.   VITAL SIGNS:      05/19/2017 01:35 PM  Weight 160 lb / 72.57 kg  Height 67 in / 170.18 cm  BP 132/76 mmHg  Pulse 112 /min  BMI 25.1 kg/m   GU PHYSICAL EXAMINATION:    Urethral Meatus: Normal size. Normal position. No discharge.    MULTI-SYSTEM PHYSICAL EXAMINATION:    Constitutional: Well-nourished. No physical deformities. Normally developed. Good grooming.  Respiratory: No labored breathing, no use of accessory muscles. Normal breath sounds. Clear bilaterally.  Cardiovascular: Regular rate and rhythm. No murmur, no gallop. Normal temperature, normal extremity pulses, no swelling, no varicosities.     PAST DATA REVIEWED:  Source Of History:  Patient  Urine Test Review:   Urinalysis   PROCEDURES:         Flexible Cystoscopy - 52000  Risks, benefits, and some of the potential complications of the procedure were discussed at length with the patient including infection, bleeding, voiding discomfort, urinary retention, fever, chills, sepsis, and others. All questions were answered. Informed consent was obtained. Antibiotic prophylaxis was given. Sterile technique and intraurethral analgesia were used.  Meatus:  Normal size. Normal location. Normal condition.  Urethra:  No hypermobility. No leakage.  Ureteral Orifices:  Normal location. Normal size. Normal shape. Effluxed clear urine.  Bladder:  A systematic examination of bladder was performed. This did reveal continued raised and mildly suspicious mucosal changes to the left of the ureteral orifice and extending toward the left lateral bladder wall. This area measures approximately 2 cm now and is increased in size compared to prior evaluation. There are no definite tumors but there is suspicion of recurrent low-grade disease. On the right lateral wall, there is 1 very small 1 cm area that also looks similar and may be possible tumor recurrence. Prior resection sites are noted. A bladder washing was obtained for cytology.      Chaperone: Eddie North The lower urinary tract was carefully examined. The procedure was well-tolerated and without complications. Antibiotic instructions were given. Instructions were given to call the office immediately for bloody urine, difficulty  urinating, urinary retention, painful or frequent urination, fever, chills, nausea, vomiting or other illness. The patient stated that she understood these instructions and would comply with them.         Urinalysis w/Scope Dipstick Dipstick Cont'd Micro  Color: Yellow Bilirubin: Neg WBC/hpf: 0 - 5/hpf  Appearance: Clear Ketones: Neg RBC/hpf: NS (Not Seen)  Specific Gravity: 1.025 Blood: Neg Bacteria: NS (Not Seen)  pH: 5.5 Protein: 1+ Cystals: NS (Not Seen)  Glucose: Neg Urobilinogen: 0.2 Casts: Hyaline    Nitrites: Neg Trichomonas: Not Present    Leukocyte Esterase: Neg Mucous: Present      Epithelial Cells: NS (Not Seen)      Yeast: NS (Not Seen)      Sperm: Not Present    ASSESSMENT:      ICD-10 Details  1 GU:   Bladder Cancer overlapping sites - C67.8    PLAN:           Orders Labs Urine Cytology  Lab Notes: Washing          Schedule Return Visit/Planned Activity: Other See Visit Notes             Note: Will call to schedule surgery          Document Letter(s):  Created for Patient: Clinical Summary  Notes:   1. Bladder cancer: We reviewed her cystoscopic findings today which do suggest some increase in size of her mucosal changes compared to cystoscopy 3 months ago. This does raise concern for possible tumor recurrence and I have recommended that we proceed with cystoscopy and transurethral resection/biopsy in the operating room along with retrograde pyelography and postoperative mitomycin-C installation. We reviewed the potential risks and complications associated with this procedure. Informed consent has been obtained.   Cc: Dr. Domenick Gong    * Signed by Raynelle Bring, M.D. on 05/19/17 at 8:07 PM (EDT)*

## 2017-06-04 NOTE — Anesthesia Preprocedure Evaluation (Addendum)
Anesthesia Evaluation  Patient identified by MRN, date of birth, ID band Patient awake    Reviewed: Allergy & Precautions, NPO status , Patient's Chart, lab work & pertinent test results  Airway Mallampati: II  TM Distance: >3 FB Neck ROM: Full    Dental no notable dental hx. (+) Dental Advisory Given, Chipped   Pulmonary neg pulmonary ROS, former smoker,    Pulmonary exam normal breath sounds clear to auscultation       Cardiovascular Exercise Tolerance: Good negative cardio ROS Normal cardiovascular exam Rhythm:Regular Rate:Normal     Neuro/Psych negative neurological ROS  negative psych ROS   GI/Hepatic Neg liver ROS, hiatal hernia,   Endo/Other  negative endocrine ROS  Renal/GU negative Renal ROS     Musculoskeletal   Abdominal   Peds  Hematology   Anesthesia Other Findings   Reproductive/Obstetrics                           Anesthesia Physical Anesthesia Plan  ASA: II  Anesthesia Plan: General   Post-op Pain Management:    Induction: Intravenous  PONV Risk Score and Plan: Treatment may vary due to age or medical condition  Airway Management Planned: Oral ETT  Additional Equipment:   Intra-op Plan:   Post-operative Plan: Extubation in OR  Informed Consent: I have reviewed the patients History and Physical, chart, labs and discussed the procedure including the risks, benefits and alternatives for the proposed anesthesia with the patient or authorized representative who has indicated his/her understanding and acceptance.   Dental advisory given  Plan Discussed with: CRNA  Anesthesia Plan Comments:         Anesthesia Quick Evaluation

## 2017-06-05 ENCOUNTER — Ambulatory Visit (HOSPITAL_COMMUNITY)
Admission: RE | Admit: 2017-06-05 | Discharge: 2017-06-05 | Disposition: A | Discharge status: Home / Self Care | Payer: Medicare Other | Source: Ambulatory Visit | Attending: Urology | Admitting: Urology

## 2017-06-05 ENCOUNTER — Encounter (HOSPITAL_COMMUNITY)
Admission: RE | Disposition: A | Discharge status: Home / Self Care | Payer: Self-pay | Source: Ambulatory Visit | Attending: Urology

## 2017-06-05 ENCOUNTER — Ambulatory Visit (HOSPITAL_COMMUNITY): Payer: Medicare Other

## 2017-06-05 ENCOUNTER — Ambulatory Visit (HOSPITAL_COMMUNITY): Payer: Medicare Other | Admitting: Anesthesiology

## 2017-06-05 ENCOUNTER — Encounter (HOSPITAL_COMMUNITY): Payer: Self-pay | Admitting: Certified Registered"

## 2017-06-05 DIAGNOSIS — D090 Carcinoma in situ of bladder: Secondary | ICD-10-CM | POA: Diagnosis not present

## 2017-06-05 DIAGNOSIS — Z87891 Personal history of nicotine dependence: Secondary | ICD-10-CM | POA: Insufficient documentation

## 2017-06-05 DIAGNOSIS — C678 Malignant neoplasm of overlapping sites of bladder: Secondary | ICD-10-CM | POA: Diagnosis not present

## 2017-06-05 DIAGNOSIS — C679 Malignant neoplasm of bladder, unspecified: Secondary | ICD-10-CM | POA: Diagnosis not present

## 2017-06-05 HISTORY — PX: TRANSURETHRAL RESECTION OF BLADDER TUMOR: SHX2575

## 2017-06-05 SURGERY — TURBT (TRANSURETHRAL RESECTION OF BLADDER TUMOR)
Anesthesia: General | Site: Ureter | Laterality: Bilateral

## 2017-06-05 MED ORDER — SUGAMMADEX SODIUM 200 MG/2ML IV SOLN
INTRAVENOUS | Status: DC | PRN
  Administered 2017-06-05: 150 mg via INTRAVENOUS

## 2017-06-05 MED ORDER — MIDAZOLAM HCL 2 MG/2ML IJ SOLN
INTRAMUSCULAR | Status: DC | PRN
  Administered 2017-06-05 (×2): 1 mg via INTRAVENOUS

## 2017-06-05 MED ORDER — 0.9 % SODIUM CHLORIDE (POUR BTL) OPTIME
TOPICAL | Status: DC | PRN
  Administered 2017-06-05: 1000 mL

## 2017-06-05 MED ORDER — PROPOFOL 10 MG/ML IV BOLUS
INTRAVENOUS | Status: DC | PRN
  Administered 2017-06-05: 170 mg via INTRAVENOUS

## 2017-06-05 MED ORDER — MEPERIDINE HCL 50 MG/ML IJ SOLN: 6.2500 mg | INTRAMUSCULAR | Status: DC | PRN

## 2017-06-05 MED ORDER — LACTATED RINGERS IV SOLN
INTRAVENOUS | Status: DC
  Administered 2017-06-05 (×2): via INTRAVENOUS

## 2017-06-05 MED ORDER — IOHEXOL 300 MG/ML  SOLN
INTRAMUSCULAR | Status: DC | PRN
  Administered 2017-06-05: 10 mL via URETHRAL

## 2017-06-05 MED ORDER — LIDOCAINE 2% (20 MG/ML) 5 ML SYRINGE
INTRAMUSCULAR | Status: DC | PRN
  Administered 2017-06-05: 100 mg via INTRAVENOUS

## 2017-06-05 MED ORDER — FENTANYL CITRATE (PF) 100 MCG/2ML IJ SOLN
INTRAMUSCULAR | Status: DC | PRN
  Administered 2017-06-05: 25 ug via INTRAVENOUS
  Administered 2017-06-05: 50 ug via INTRAVENOUS
  Administered 2017-06-05: 25 ug via INTRAVENOUS

## 2017-06-05 MED ORDER — ROCURONIUM BROMIDE 10 MG/ML (PF) SYRINGE
PREFILLED_SYRINGE | INTRAVENOUS | Status: DC | PRN
  Administered 2017-06-05: 35 mg via INTRAVENOUS

## 2017-06-05 MED ORDER — PROPOFOL 10 MG/ML IV BOLUS
INTRAVENOUS | Status: AC
  Filled 2017-06-05: qty 20

## 2017-06-05 MED ORDER — PHENYLEPHRINE 40 MCG/ML (10ML) SYRINGE FOR IV PUSH (FOR BLOOD PRESSURE SUPPORT)
PREFILLED_SYRINGE | INTRAVENOUS | Status: DC | PRN
  Administered 2017-06-05: 200 ug via INTRAVENOUS
  Administered 2017-06-05: 120 ug via INTRAVENOUS
  Administered 2017-06-05 (×3): 80 ug via INTRAVENOUS
  Administered 2017-06-05: 120 ug via INTRAVENOUS
  Administered 2017-06-05: 80 ug via INTRAVENOUS

## 2017-06-05 MED ORDER — CEFAZOLIN SODIUM-DEXTROSE 2-4 GM/100ML-% IV SOLN
2.0000 g | Freq: Once | INTRAVENOUS | Status: AC
  Administered 2017-06-05: 2 g via INTRAVENOUS
  Filled 2017-06-05: qty 100

## 2017-06-05 MED ORDER — PROMETHAZINE HCL 25 MG/ML IJ SOLN: 6.2500 mg | INTRAMUSCULAR | Status: DC | PRN

## 2017-06-05 MED ORDER — HYDROCODONE-ACETAMINOPHEN 7.5-325 MG PO TABS: 1.0000 | ORAL_TABLET | Freq: Once | ORAL | Status: DC | PRN

## 2017-06-05 MED ORDER — HYDROMORPHONE HCL 1 MG/ML IJ SOLN: 0.2500 mg | INTRAMUSCULAR | Status: DC | PRN

## 2017-06-05 MED ORDER — TRAMADOL HCL 50 MG PO TABS: 50.0000 mg | ORAL_TABLET | Freq: Four times a day (QID) | ORAL | 0 refills | Status: DC | PRN

## 2017-06-05 MED ORDER — ACETAMINOPHEN 10 MG/ML IV SOLN: 1000.0000 mg | Freq: Once | INTRAVENOUS | Status: DC | PRN

## 2017-06-05 MED ORDER — PHENAZOPYRIDINE HCL 100 MG PO TABS: 100.0000 mg | ORAL_TABLET | Freq: Three times a day (TID) | ORAL | 0 refills | Status: DC | PRN

## 2017-06-05 MED ORDER — DEXAMETHASONE SODIUM PHOSPHATE 10 MG/ML IJ SOLN
INTRAMUSCULAR | Status: DC | PRN
  Administered 2017-06-05: 10 mg via INTRAVENOUS

## 2017-06-05 MED ORDER — MIDAZOLAM HCL 2 MG/2ML IJ SOLN
INTRAMUSCULAR | Status: AC
  Filled 2017-06-05: qty 2

## 2017-06-05 MED ORDER — FENTANYL CITRATE (PF) 100 MCG/2ML IJ SOLN
INTRAMUSCULAR | Status: AC
  Filled 2017-06-05: qty 2

## 2017-06-05 MED ORDER — ONDANSETRON HCL 4 MG/2ML IJ SOLN
INTRAMUSCULAR | Status: DC | PRN
  Administered 2017-06-05: 4 mg via INTRAVENOUS

## 2017-06-05 MED ORDER — SODIUM CHLORIDE 0.9 % IR SOLN
Status: DC | PRN
  Administered 2017-06-05: 6000 mL via INTRAVESICAL

## 2017-06-05 SURGICAL SUPPLY — 14 items
BAG URINE DRAINAGE (UROLOGICAL SUPPLIES) IMPLANT
BAG URO CATCHER STRL LF (MISCELLANEOUS) ×2 IMPLANT
COVER FOOTSWITCH UNIV (MISCELLANEOUS) IMPLANT
ELECT REM PT RETURN 15FT ADLT (MISCELLANEOUS) ×2 IMPLANT
GLOVE BIOGEL M STRL SZ7.5 (GLOVE) ×4 IMPLANT
GOWN STRL REUS W/TWL LRG LVL3 (GOWN DISPOSABLE) ×4 IMPLANT
IV NS IRRIG 3000ML ARTHROMATIC (IV SOLUTION) ×2 IMPLANT
LOOP CUT BIPOLAR 24F LRG (ELECTROSURGICAL) ×2 IMPLANT
MANIFOLD NEPTUNE II (INSTRUMENTS) ×2 IMPLANT
PACK CYSTO (CUSTOM PROCEDURE TRAY) ×2 IMPLANT
SET ASPIRATION TUBING (TUBING) IMPLANT
SYRINGE IRR TOOMEY STRL 70CC (SYRINGE) IMPLANT
TUBING CONNECTING 10 (TUBING) ×2 IMPLANT
TUBING UROLOGY SET (TUBING) ×2 IMPLANT

## 2017-06-05 NOTE — Op Note (Signed)
Preoperative diagnosis: Bladder cancer  Postoperative diagnosis: Bladder cancer  Procedures: 1.  Cystoscopy 2.  Exam under anesthesia 3.  Bilateral retrograde pyelography with interpretation 4.  Transurethral resection of bladder tumor (2.5 cm)   Anesthesia: General  Complications: None  EBL: Minimal  Specimens: 1.  Right posterior bladder tumor 2.  Left lateral bladder tumor  Disposition of specimens: Pathology  Intraoperative findings: Bilateral retrograde pyelography was performed with a 6 French ureteral catheter and Omnipaque contrast.  There were no filling defects of the left or right renal collecting systems and no filling defects or other abnormalities of the ureters bilaterally.  Indication: This is a 72 year old female with a history of low risk, non-muscle invasive bladder cancer.  She recently was noted to have some abnormal mucosal findings suggestive of possible tumor recurrence located on the left lateral bladder wall and right posterior bladder.  It was recommended that she undergo transurethral resection.  We discussed proceeding with the above procedures.  The potential risks, complications, and alternative options were discussed in detail and informed consent was obtained.  Description of procedure: The patient was taken to the operating room and a general anesthetic was administered.  He was given preoperative antibiotics, placed in the dorsal lithotomy position, and prepped and draped in the usual sterile fashion.  Next a preoperative timeout was performed.  The patient was administered a paralytic agent in preparation for bladder tumor resection.  Cystoscopy was performed with a 22 French cystoscope sheath.  This allowed for complete visualization of the bladder with both a 30 and 70 degree lens.  The previously noted raised abnormal mucosa overlying the left lateral bladder wall was visualized.  This extended for a diameter of approximately 2.5 cm.  Further  evaluation revealed a very small raised area toward the right posterior bladder wall that also was somewhat suspicious for possible tumor.  Bilateral retrograde pyelography was then performed with a 6 French ureteral catheter and Omnipaque contrast.  Findings are as dictated above.  No obvious abnormalities were noted.  Attention then returned to the bladder.  A 26 French resectoscope sheath was carefully placed into the bladder using the visual obturator.  The previously noted right posterior bladder tumor area was resected with bipolar loop resection.  Hemostasis was achieved with bipolar cautery.  A similar procedure was performed on the left lateral bladder wall with specimen sent separately.  The bladder was emptied and reinspected and hemostasis was ensured.  The patient tolerated the procedure well without complications.  Considering the uncertainty of whether this represented a true bladder cancer recurrence, it was decided not to proceed with postoperative intravesical chemotherapy.  The patient was able to be awakened and transferred to the recovery unit in satisfactory condition.

## 2017-06-05 NOTE — Anesthesia Procedure Notes (Signed)
Procedure Name: Intubation Date/Time: 06/05/2017 5:16 PM Performed by: Cynda Familia, CRNA Pre-anesthesia Checklist: Patient identified, Emergency Drugs available, Suction available and Patient being monitored Patient Re-evaluated:Patient Re-evaluated prior to induction Oxygen Delivery Method: Circle System Utilized Preoxygenation: Pre-oxygenation with 100% oxygen Induction Type: IV induction Ventilation: Mask ventilation without difficulty Laryngoscope Size: Miller and 2 Grade View: Grade I Tube type: Oral Tube size: 7.0 mm Number of attempts: 1 Airway Equipment and Method: Stylet Placement Confirmation: ETT inserted through vocal cords under direct vision,  positive ETCO2 and breath sounds checked- equal and bilateral Secured at: 22 cm Tube secured with: Tape Dental Injury: Teeth and Oropharynx as per pre-operative assessment  Comments: Smooth IV induction Houser--- intubation AM CRNA atraumatic-- front teeth with irregular surfaces-- unchanged after laryngoscopy--- bilat BS Houser

## 2017-06-05 NOTE — Anesthesia Procedure Notes (Signed)
Date/Time: 06/05/2017 5:59 PM Performed by: Cynda Familia, CRNA Oxygen Delivery Method: Simple face mask Placement Confirmation: positive ETCO2 and breath sounds checked- equal and bilateral Dental Injury: Teeth and Oropharynx as per pre-operative assessment

## 2017-06-05 NOTE — Transfer of Care (Signed)
Immediate Anesthesia Transfer of Care Note  Patient: Donna [name]  Procedure(s) Performed: TRANSURETHRAL RESECTION OF BLADDER TUMOR (TURBT)/ CYSTSCOPY/ BILATERAL RETROGADE/ POSSIBLE LEFT URETERAL STENT (N/A )  Patient Location: PACU  Anesthesia Type:General  Level of Consciousness: awake and alert   Airway & Oxygen Therapy: Patient Spontanous Breathing and Patient connected to face mask oxygen  Post-op Assessment: Report given to RN and Post -op Vital signs reviewed and stable  Post vital signs: Reviewed and stable  Last Vitals:  Vitals Value Taken Time  BP 141/80 06/05/2017  6:11 PM  Temp    Pulse 110 06/05/2017  6:13 PM  Resp 0 06/05/2017  6:13 PM  SpO2 100 % 06/05/2017  6:13 PM  Vitals shown include unvalidated device data.  Last Pain:  Vitals:   06/05/17 1503  TempSrc:   PainSc: 0-No pain         Complications: No apparent anesthesia complications

## 2017-06-05 NOTE — Discharge Instructions (Addendum)
1. You may see some blood in the urine and may have some burning with urination for 48-72 hours. You also may notice that you have to urinate more frequently or urgently after your procedure which is normal.  °2. You should call should you develop an inability urinate, fever > 101, persistent nausea and vomiting that prevents you from eating or drinking to stay hydrated.  °

## 2017-06-05 NOTE — Interval H&P Note (Signed)
History and Physical Interval Note:  06/05/2017 4:43 PM  Donna [name]  has presented today for surgery, with the diagnosis of BLADDER CANCER  The various methods of treatment have been discussed with the patient and family. After consideration of risks, benefits and other options for treatment, the patient has consented to  Procedure(s) with comments: TRANSURETHRAL RESECTION OF BLADDER TUMOR (TURBT)/ CYSTSCOPY/ BILATERAL RETROGADE/ POSSIBLE LEFT URETERAL STENT (N/A) - GENERAL ANESTHESIA WITH PARALYSIS as a surgical intervention .  The patient's history has been reviewed, patient examined, no change in status, stable for surgery.  I have reviewed the patient's chart and labs.  Questions were answered to the patient's satisfaction.     Lauree Yurick,LES

## 2017-06-06 ENCOUNTER — Encounter (HOSPITAL_COMMUNITY): Payer: Self-pay | Admitting: Urology

## 2017-06-06 NOTE — Anesthesia Postprocedure Evaluation (Signed)
Anesthesia Post Note  Patient: Donna [name]  Procedure(s) Performed: TRANSURETHRAL RESECTION OF BLADDER TUMOR (TURBT)/ CYSTSCOPY/ BILATERAL RETROGADE PYLEOGRAM (Bilateral Ureter)     Patient location during evaluation: PACU Anesthesia Type: General Level of consciousness: awake and alert Pain management: pain level controlled Vital Signs Assessment: post-procedure vital signs reviewed and stable Respiratory status: spontaneous breathing, nonlabored ventilation, respiratory function stable and patient connected to nasal cannula oxygen Cardiovascular status: blood pressure returned to baseline and stable Postop Assessment: no apparent nausea or vomiting Anesthetic complications: no    Last Vitals:  Vitals:   06/05/17 1845 06/05/17 1913  BP: (!) 146/74 (!) 140/96  Pulse: 72 76  Resp: 12 16  Temp: 36.5 C 36.6 C  SpO2: 94% 96%    Last Pain:  Vitals:   06/05/17 1913  TempSrc:   PainSc: 0-No pain                 Barnet Glasgow

## 2017-06-27 DIAGNOSIS — R8279 Other abnormal findings on microbiological examination of urine: Secondary | ICD-10-CM | POA: Diagnosis not present

## 2017-06-27 DIAGNOSIS — C678 Malignant neoplasm of overlapping sites of bladder: Secondary | ICD-10-CM | POA: Diagnosis not present

## 2017-07-13 DIAGNOSIS — C678 Malignant neoplasm of overlapping sites of bladder: Secondary | ICD-10-CM | POA: Diagnosis not present

## 2017-07-13 DIAGNOSIS — Z5111 Encounter for antineoplastic chemotherapy: Secondary | ICD-10-CM | POA: Diagnosis not present

## 2017-07-20 DIAGNOSIS — C678 Malignant neoplasm of overlapping sites of bladder: Secondary | ICD-10-CM | POA: Diagnosis not present

## 2017-07-20 DIAGNOSIS — Z5111 Encounter for antineoplastic chemotherapy: Secondary | ICD-10-CM | POA: Diagnosis not present

## 2017-07-27 DIAGNOSIS — Z5111 Encounter for antineoplastic chemotherapy: Secondary | ICD-10-CM | POA: Diagnosis not present

## 2017-07-27 DIAGNOSIS — C678 Malignant neoplasm of overlapping sites of bladder: Secondary | ICD-10-CM | POA: Diagnosis not present

## 2017-08-03 DIAGNOSIS — Z5111 Encounter for antineoplastic chemotherapy: Secondary | ICD-10-CM | POA: Diagnosis not present

## 2017-08-03 DIAGNOSIS — C678 Malignant neoplasm of overlapping sites of bladder: Secondary | ICD-10-CM | POA: Diagnosis not present

## 2017-08-10 DIAGNOSIS — C678 Malignant neoplasm of overlapping sites of bladder: Secondary | ICD-10-CM | POA: Diagnosis not present

## 2017-08-10 DIAGNOSIS — Z5111 Encounter for antineoplastic chemotherapy: Secondary | ICD-10-CM | POA: Diagnosis not present

## 2017-08-21 DIAGNOSIS — Z5111 Encounter for antineoplastic chemotherapy: Secondary | ICD-10-CM | POA: Diagnosis not present

## 2017-08-21 DIAGNOSIS — C678 Malignant neoplasm of overlapping sites of bladder: Secondary | ICD-10-CM | POA: Diagnosis not present

## 2017-08-25 ENCOUNTER — Other Ambulatory Visit: Payer: Self-pay | Admitting: Internal Medicine

## 2017-08-25 DIAGNOSIS — Z1231 Encounter for screening mammogram for malignant neoplasm of breast: Secondary | ICD-10-CM

## 2017-09-11 DIAGNOSIS — R05 Cough: Secondary | ICD-10-CM | POA: Diagnosis not present

## 2017-10-09 ENCOUNTER — Ambulatory Visit
Admission: RE | Admit: 2017-10-09 | Discharge: 2017-10-09 | Disposition: A | Discharge status: Home / Self Care | Payer: Medicare Other | Source: Ambulatory Visit | Attending: Internal Medicine | Admitting: Internal Medicine

## 2017-10-09 DIAGNOSIS — Z1231 Encounter for screening mammogram for malignant neoplasm of breast: Secondary | ICD-10-CM | POA: Diagnosis not present

## 2017-11-01 ENCOUNTER — Encounter: Payer: Self-pay | Admitting: Gynecology

## 2017-11-01 ENCOUNTER — Ambulatory Visit: Payer: Medicare Other | Admitting: Gynecology

## 2017-11-01 VITALS — BP 124/74 | Ht 66.5 in | Wt 161.0 lb

## 2017-11-01 DIAGNOSIS — Z124 Encounter for screening for malignant neoplasm of cervix: Secondary | ICD-10-CM

## 2017-11-01 DIAGNOSIS — Z01419 Encounter for gynecological examination (general) (routine) without abnormal findings: Secondary | ICD-10-CM | POA: Diagnosis not present

## 2017-11-01 DIAGNOSIS — M8580 Other specified disorders of bone density and structure, unspecified site: Secondary | ICD-10-CM

## 2017-11-01 DIAGNOSIS — C678 Malignant neoplasm of overlapping sites of bladder: Secondary | ICD-10-CM | POA: Diagnosis not present

## 2017-11-01 DIAGNOSIS — N952 Postmenopausal atrophic vaginitis: Secondary | ICD-10-CM

## 2017-11-01 NOTE — Progress Notes (Signed)
    Donna [name] 1945-11-30 [id number]        72 y.o.  G3P3003 for breast and pelvic exam.  Doing well without complaints.  Past medical history,surgical history, problem list, medications, allergies, family history and social history were all reviewed and documented as reviewed in the EPIC chart.  ROS:  Performed with pertinent positives and negatives included in the history, assessment and plan.   Additional significant findings : None   Exam: [name] assistant Vitals:   11/01/17 0906  BP: 124/74  Weight: 161 lb (73 kg)  Height: 5' 6.5" (1.689 m)   Body mass index is 25.6 kg/m.  General appearance:  Normal affect, orientation and appearance. Skin: Grossly normal HEENT: Without gross lesions.  No cervical or supraclavicular adenopathy. Thyroid normal.  Lungs:  Clear without wheezing, rales or rhonchi Cardiac: RR, without RMG Abdominal:  Soft, nontender, without masses, guarding, rebound, organomegaly or hernia Breasts:  Examined lying and sitting without masses, retractions, discharge or axillary adenopathy. Pelvic:  Ext, BUS, Vagina: With atrophic changes  Cervix: With atrophic changes.  Pap smear done  Uterus: Anteverted, normal size, shape and contour, midline and mobile nontender   Adnexa: Without masses or tenderness    Anus and perineum: Normal   Rectovaginal: Normal sphincter tone without palpated masses or tenderness.    Assessment/Plan:  72 y.o. G70P3003 female for breast and pelvic exam.   1. Postmenopausal.  No significant menopausal symptoms or any bleeding. 2. Pap smear/HPV 2015.  No history of significant abnormal Pap smears.  Reviewed current screening guidelines and options to stop screening based on age reviewed.  Patient uncomfortable with stop screening and a Pap smear was done today. 3. Mammography 10/2017.  Continue with annual mammography next year.  Breast exam normal today. 4. Bone health.  Reports osteopenia followed by Dr. Osborne Casco.  Last bone  density 2018.  We will continue to follow-up with him in reference to bone health. 5. Colonoscopy 2017.  Repeat at their recommended interval. 6. Health maintenance.  No routine lab work done as patient does this elsewhere.  Follow-up 1 year, sooner as needed.   [name], 9:30 AM 11/01/2017

## 2017-11-01 NOTE — Addendum Note (Signed)
Addended by: Nelva Nay on: 11/01/2017 09:56 AM   Modules accepted: Orders

## 2017-11-01 NOTE — Patient Instructions (Addendum)
Follow-up in 1 year for annual exam 

## 2017-11-02 LAB — PAP IG W/ RFLX HPV ASCU

## 2017-11-11 DIAGNOSIS — Z23 Encounter for immunization: Secondary | ICD-10-CM | POA: Diagnosis not present

## 2018-01-15 DIAGNOSIS — H5203 Hypermetropia, bilateral: Secondary | ICD-10-CM | POA: Diagnosis not present

## 2018-02-06 DIAGNOSIS — I788 Other diseases of capillaries: Secondary | ICD-10-CM | POA: Diagnosis not present

## 2018-02-06 DIAGNOSIS — L812 Freckles: Secondary | ICD-10-CM | POA: Diagnosis not present

## 2018-02-06 DIAGNOSIS — L821 Other seborrheic keratosis: Secondary | ICD-10-CM | POA: Diagnosis not present

## 2018-02-06 DIAGNOSIS — Z85828 Personal history of other malignant neoplasm of skin: Secondary | ICD-10-CM | POA: Diagnosis not present

## 2018-02-21 DIAGNOSIS — C678 Malignant neoplasm of overlapping sites of bladder: Secondary | ICD-10-CM | POA: Diagnosis not present

## 2018-03-07 DIAGNOSIS — M8580 Other specified disorders of bone density and structure, unspecified site: Secondary | ICD-10-CM

## 2018-03-07 HISTORY — DX: Other specified disorders of bone density and structure, unspecified site: M85.80

## 2018-03-20 ENCOUNTER — Encounter: Payer: Self-pay | Admitting: Gynecology

## 2018-03-20 DIAGNOSIS — R82998 Other abnormal findings in urine: Secondary | ICD-10-CM | POA: Diagnosis not present

## 2018-03-20 DIAGNOSIS — E559 Vitamin D deficiency, unspecified: Secondary | ICD-10-CM | POA: Diagnosis not present

## 2018-03-20 DIAGNOSIS — M859 Disorder of bone density and structure, unspecified: Secondary | ICD-10-CM | POA: Diagnosis not present

## 2018-03-20 DIAGNOSIS — E7800 Pure hypercholesterolemia, unspecified: Secondary | ICD-10-CM | POA: Diagnosis not present

## 2018-03-21 ENCOUNTER — Encounter: Payer: Self-pay | Admitting: Gynecology

## 2018-03-27 DIAGNOSIS — M859 Disorder of bone density and structure, unspecified: Secondary | ICD-10-CM | POA: Diagnosis not present

## 2018-03-27 DIAGNOSIS — C679 Malignant neoplasm of bladder, unspecified: Secondary | ICD-10-CM | POA: Diagnosis not present

## 2018-03-27 DIAGNOSIS — Z0000 Encounter for general adult medical examination without abnormal findings: Secondary | ICD-10-CM | POA: Diagnosis not present

## 2018-03-27 DIAGNOSIS — E559 Vitamin D deficiency, unspecified: Secondary | ICD-10-CM | POA: Diagnosis not present

## 2018-03-28 DIAGNOSIS — Z1212 Encounter for screening for malignant neoplasm of rectum: Secondary | ICD-10-CM | POA: Diagnosis not present

## 2018-08-15 DIAGNOSIS — Z20828 Contact with and (suspected) exposure to other viral communicable diseases: Secondary | ICD-10-CM | POA: Diagnosis not present

## 2018-08-31 ENCOUNTER — Other Ambulatory Visit: Payer: Self-pay | Admitting: Internal Medicine

## 2018-08-31 DIAGNOSIS — Z1231 Encounter for screening mammogram for malignant neoplasm of breast: Secondary | ICD-10-CM

## 2018-09-04 DIAGNOSIS — Z8551 Personal history of malignant neoplasm of bladder: Secondary | ICD-10-CM | POA: Diagnosis not present

## 2018-09-14 DIAGNOSIS — Z03818 Encounter for observation for suspected exposure to other biological agents ruled out: Secondary | ICD-10-CM | POA: Diagnosis not present

## 2018-09-14 DIAGNOSIS — Z7189 Other specified counseling: Secondary | ICD-10-CM | POA: Diagnosis not present

## 2018-10-08 IMAGING — CT CT HEART SCORING
2 series · 16 of 20 positions shown, 18 images · non-contrast
Comparison: Chest radiograph 05/24/2013

CLINICAL DATA: 71-year-old white female with hypercholesterolemia.
Former smoker.

EXAM:
CT HEART FOR CALCIUM SCORING
TECHNIQUE: CT heart was performed on a 64 channel system using prospective ECG
gating.
A non-contrast exam for calcium scoring was performed.
Note that this exam targets the heart and the chest was not imaged
in its entirety.

[Series 2: smartscore - gated 0.4 sec · axial · 0.49mm/px · z∈[-230,-108]mm · 8 of 64 slices shown, 10 images]
[im 8/64  vessel]
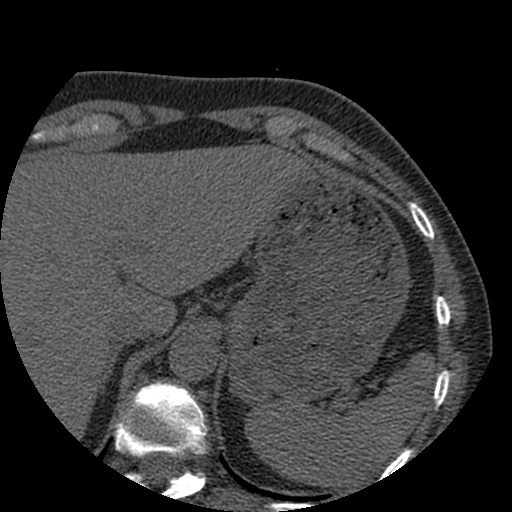
[im 8/64  lung]
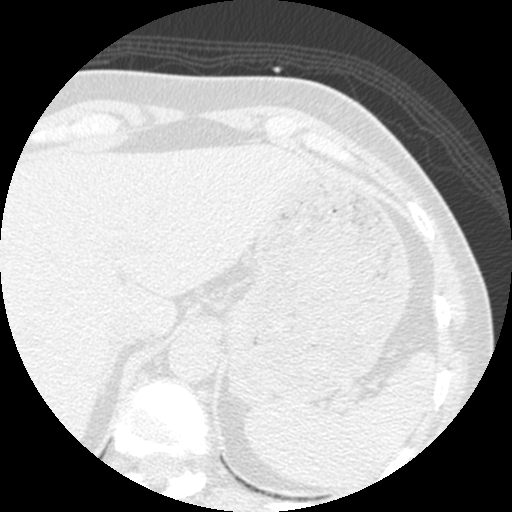
[im 15/64  vessel]
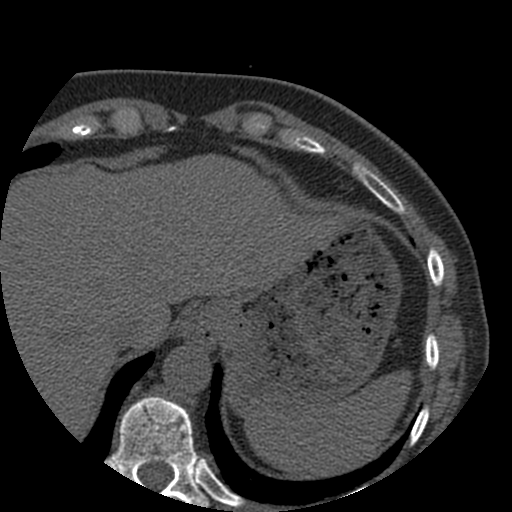
[im 22/64  vessel]
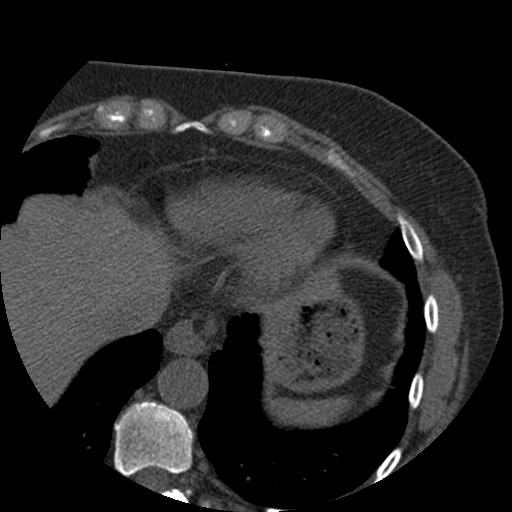
[im 29/64  vessel]
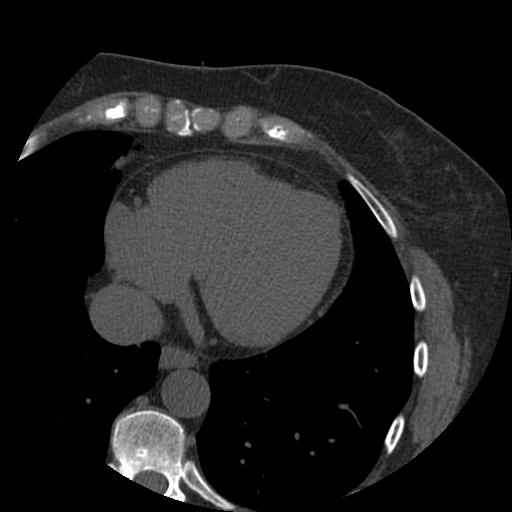
[im 36/64  vessel]
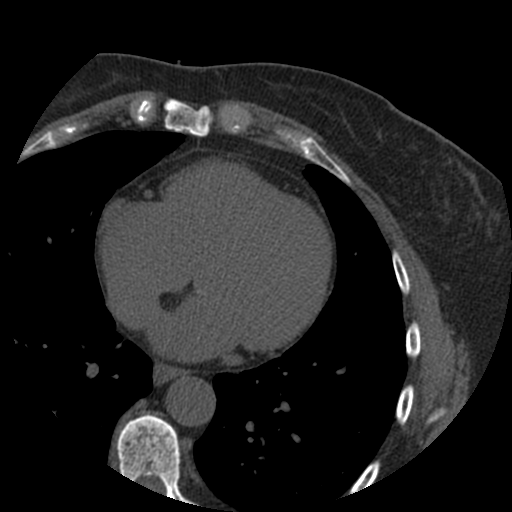
[im 36/64  lung]
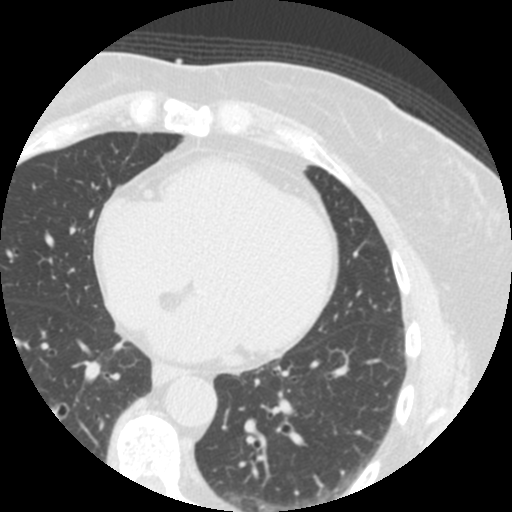
[im 43/64  vessel]
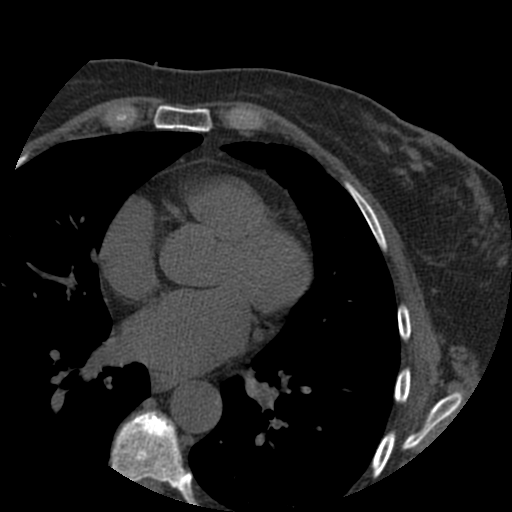
[im 50/64  vessel]
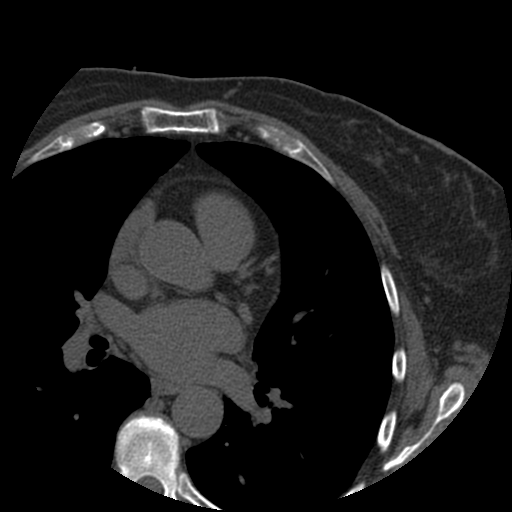
[im 57/64  vessel]
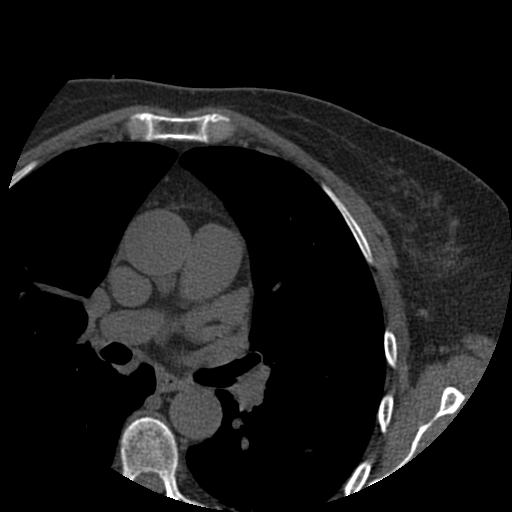

[Series 3: standard 5mm · axial · 0.70mm/px · z∈[-230,-108]mm · 8 of 64 slices shown]
[im 8/64  vessel]
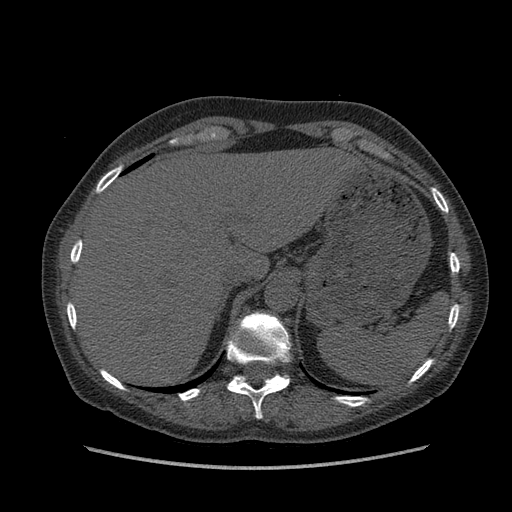
[im 15/64  vessel]
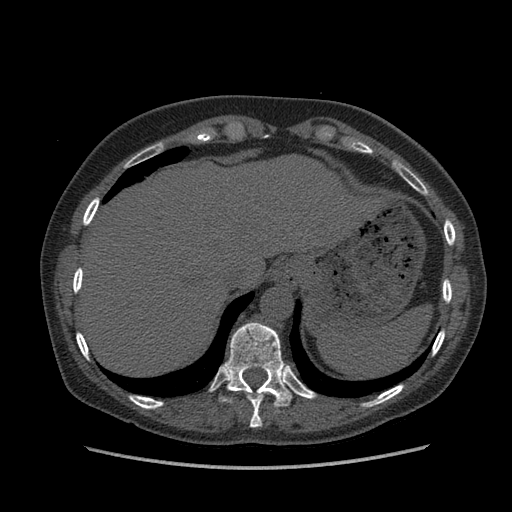
[im 22/64  vessel]
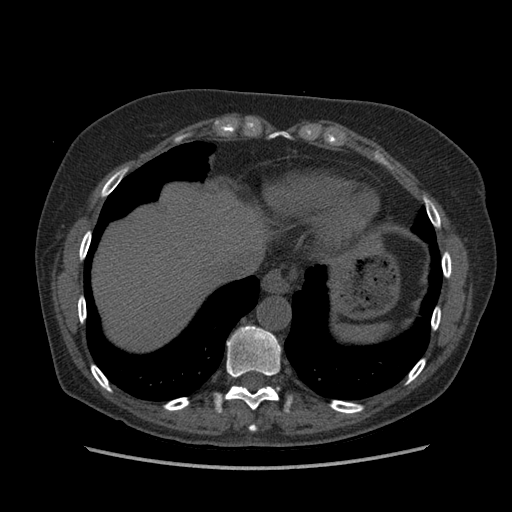
[im 29/64  vessel]
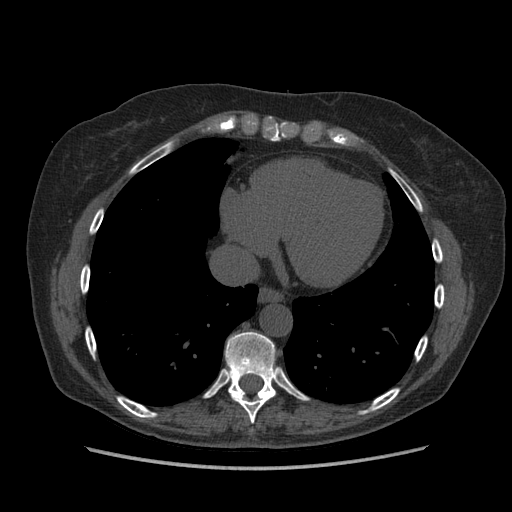
[im 36/64  vessel]
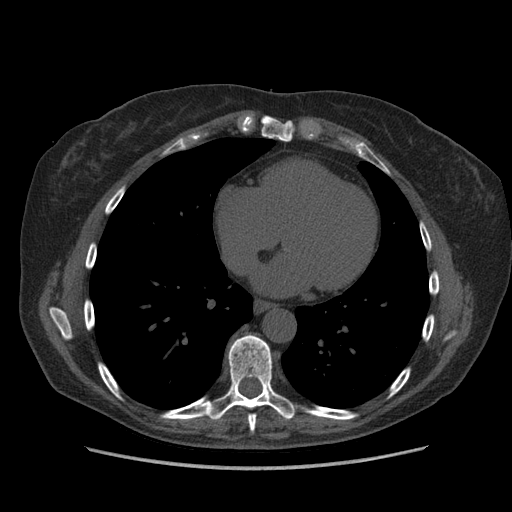
[im 43/64  vessel]
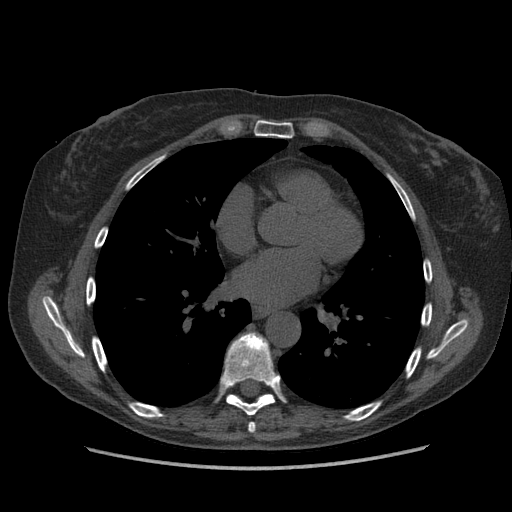
[im 50/64  vessel]
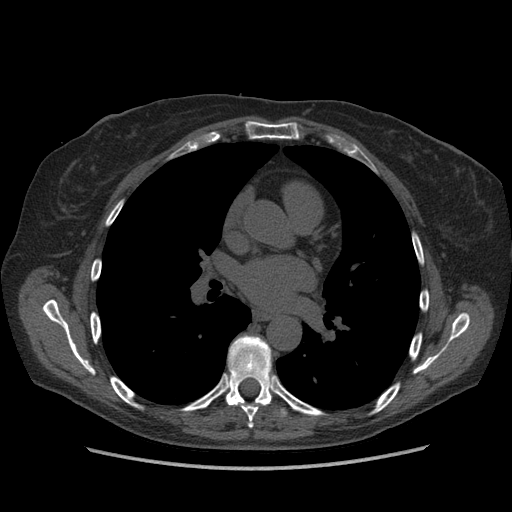
[im 57/64  vessel]
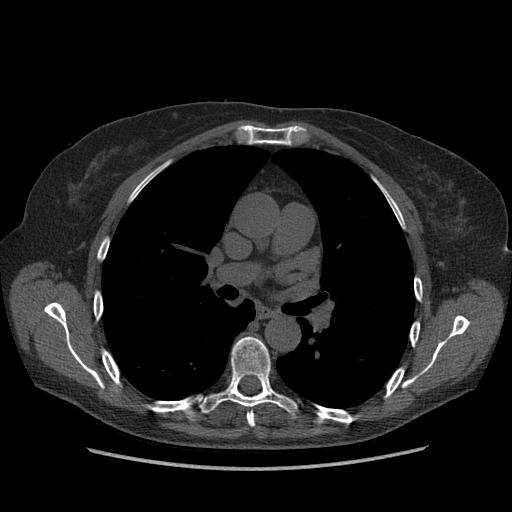

[16 of 20 positions shown; findings below may reference images not displayed]

FINDINGS: Technical quality: Good

CORONARY CALCIUM

Total Agatston Score: 0

[HOSPITAL] percentile:  0

EXTRACARDIAC FINDINGS:

Normal caliber of the visualized thoracic aorta without
calcifications. Heart size is normal without significant pericardial
fluid. No significant chest lymphadenopathy. Evidence for a small
hiatal hernia.

No pleural effusions. Questionable 4 mm nodule in the left lower
lobe on sequence 4 image 21. There appears to be some motion
artifact at this possible nodule. Otherwise, the lungs are clear. No
suspicious bone findings.
IMPRESSION: Coronary artery calcium score is 0.

Small hiatal hernia.

Question a 4 mm nodule in the left lower lobe. No follow-up needed
if patient is low-risk. Non-contrast chest CT can be considered in
12 months if patient is high-risk. This recommendation follows the
consensus statement: Guidelines for Management of Incidental
Pulmonary Nodules Detected on CT Images: From the [HOSPITAL]

## 2018-10-12 DIAGNOSIS — E7800 Pure hypercholesterolemia, unspecified: Secondary | ICD-10-CM | POA: Diagnosis not present

## 2018-10-12 DIAGNOSIS — C679 Malignant neoplasm of bladder, unspecified: Secondary | ICD-10-CM | POA: Diagnosis not present

## 2018-10-12 DIAGNOSIS — E559 Vitamin D deficiency, unspecified: Secondary | ICD-10-CM | POA: Diagnosis not present

## 2018-10-12 DIAGNOSIS — M8580 Other specified disorders of bone density and structure, unspecified site: Secondary | ICD-10-CM | POA: Diagnosis not present

## 2018-10-17 ENCOUNTER — Ambulatory Visit
Admission: RE | Admit: 2018-10-17 | Discharge: 2018-10-17 | Disposition: A | Discharge status: Home / Self Care | Payer: Medicare Other | Source: Ambulatory Visit | Attending: Internal Medicine | Admitting: Internal Medicine

## 2018-10-17 ENCOUNTER — Other Ambulatory Visit: Payer: Self-pay

## 2018-10-17 DIAGNOSIS — Z1231 Encounter for screening mammogram for malignant neoplasm of breast: Secondary | ICD-10-CM

## 2018-10-26 DIAGNOSIS — Z23 Encounter for immunization: Secondary | ICD-10-CM | POA: Diagnosis not present

## 2018-11-02 ENCOUNTER — Other Ambulatory Visit: Payer: Self-pay

## 2018-11-05 ENCOUNTER — Other Ambulatory Visit: Payer: Self-pay

## 2018-11-05 ENCOUNTER — Encounter: Payer: Self-pay | Admitting: Gynecology

## 2018-11-05 ENCOUNTER — Ambulatory Visit (INDEPENDENT_AMBULATORY_CARE_PROVIDER_SITE_OTHER): Payer: Medicare Other | Admitting: Gynecology

## 2018-11-05 VITALS — BP 120/76 | Ht 67.0 in | Wt 164.0 lb

## 2018-11-05 DIAGNOSIS — N952 Postmenopausal atrophic vaginitis: Secondary | ICD-10-CM

## 2018-11-05 DIAGNOSIS — Z01419 Encounter for gynecological examination (general) (routine) without abnormal findings: Secondary | ICD-10-CM

## 2018-11-05 NOTE — Patient Instructions (Signed)
Follow-up in 1 year, sooner as needed. 

## 2018-11-05 NOTE — Progress Notes (Signed)
    Donna [name] 27-Aug-1945 [id number]        73 y.o.  G3P3003 for breast and pelvic exam.  Without gynecologic complaints  Past medical history,surgical history, problem list, medications, allergies, family history and social history were all reviewed and documented as reviewed in the EPIC chart.  ROS:  Performed with pertinent positives and negatives included in the history, assessment and plan.   Additional significant findings : None   Exam: [name] assistant Vitals:   11/05/18 0859  BP: 120/76  Weight: 164 lb (74.4 kg)  Height: 5\' 7"  (1.702 m)   Body mass index is 25.69 kg/m.  General appearance:  Normal affect, orientation and appearance. Skin: Grossly normal HEENT: Without gross lesions.  No cervical or supraclavicular adenopathy. Thyroid normal.  Lungs:  Clear without wheezing, rales or rhonchi Cardiac: RR, without RMG Abdominal:  Soft, nontender, without masses, guarding, rebound, organomegaly or hernia Breasts:  Examined lying and sitting without masses, retractions, discharge or axillary adenopathy. Pelvic:  Ext, BUS, Vagina: With atrophic changes  Cervix: With atrophic changes  Uterus: Anteverted, normal size, shape and contour, midline and mobile nontender   Adnexa: Without masses or tenderness    Anus and perineum: Normal   Rectovaginal: Normal sphincter tone without palpated masses or tenderness.    Assessment/Plan:  73 y.o. G35P3003 female for breast and pelvic exam  1. Postmenopausal/atrophic genital changes.  No significant menopausal symptoms or any bleeding. 2. Pap smear 2019.  No Pap smear done today.  No history of significant abnormal Pap smears.  Options to stop screening per current screening guidelines reviewed.  Will readdress on an annual basis. 3. Mammography 10/2018.  Continue with annual mammography next year.  Breast exam normal today. 4. DEXA 2020.  Followed by Dr. Osborne Casco. 5. Colonoscopy 2017.  Repeat at their recommended interval. 6.  Health maintenance.  No routine lab work done as patient does this elsewhere.  Follow-up in 1 year, sooner as needed.   [name], 9:27 AM 11/05/2018

## 2018-11-09 ENCOUNTER — Emergency Department (HOSPITAL_COMMUNITY)
Admission: EM | Admit: 2018-11-09 | Discharge: 2018-11-10 | Disposition: A | Discharge status: Home / Self Care | Payer: Medicare Other | Attending: Emergency Medicine | Admitting: Emergency Medicine

## 2018-11-09 ENCOUNTER — Other Ambulatory Visit: Payer: Self-pay

## 2018-11-09 ENCOUNTER — Emergency Department (HOSPITAL_COMMUNITY): Payer: Medicare Other

## 2018-11-09 ENCOUNTER — Encounter (HOSPITAL_COMMUNITY): Payer: Self-pay | Admitting: Emergency Medicine

## 2018-11-09 DIAGNOSIS — Z8551 Personal history of malignant neoplasm of bladder: Secondary | ICD-10-CM | POA: Insufficient documentation

## 2018-11-09 DIAGNOSIS — R030 Elevated blood-pressure reading, without diagnosis of hypertension: Secondary | ICD-10-CM | POA: Diagnosis not present

## 2018-11-09 DIAGNOSIS — R0789 Other chest pain: Secondary | ICD-10-CM | POA: Diagnosis not present

## 2018-11-09 DIAGNOSIS — Z79899 Other long term (current) drug therapy: Secondary | ICD-10-CM | POA: Insufficient documentation

## 2018-11-09 DIAGNOSIS — R200 Anesthesia of skin: Secondary | ICD-10-CM | POA: Diagnosis not present

## 2018-11-09 DIAGNOSIS — Z87891 Personal history of nicotine dependence: Secondary | ICD-10-CM | POA: Diagnosis not present

## 2018-11-09 LAB — CBC
HCT: 43.7 % (ref 36.0–46.0)
Hemoglobin: 14.5 g/dL (ref 12.0–15.0)
MCH: 30.3 pg (ref 26.0–34.0)
MCHC: 33.2 g/dL (ref 30.0–36.0)
MCV: 91.2 fL (ref 80.0–100.0)
Platelets: 193 10*3/uL (ref 150–400)
RBC: 4.79 MIL/uL (ref 3.87–5.11)
RDW: 12.6 % (ref 11.5–15.5)
WBC: 3.8 10*3/uL — ABNORMAL LOW (ref 4.0–10.5)
nRBC: 0 % (ref 0.0–0.2)

## 2018-11-09 MED ORDER — SODIUM CHLORIDE 0.9% FLUSH: 3.0000 mL | Freq: Once | INTRAVENOUS | Status: DC

## 2018-11-09 NOTE — ED Triage Notes (Addendum)
Patient reports chest discomfort " indigestion" after eating supper this evening , left arm numbness and low back pain . Patient took 4 ASA tabs prior to arrival .

## 2018-11-10 LAB — BASIC METABOLIC PANEL
Anion gap: 10 (ref 5–15)
BUN: 8 mg/dL (ref 8–23)
CO2: 24 mmol/L (ref 22–32)
Calcium: 8.8 mg/dL — ABNORMAL LOW (ref 8.9–10.3)
Chloride: 101 mmol/L (ref 98–111)
Creatinine, Ser: 0.94 mg/dL (ref 0.44–1.00)
GFR calc Af Amer: >60 mL/min (ref >60)
GFR calc non Af Amer: >60 mL/min (ref >60)
Glucose, Bld: 99 mg/dL (ref 70–99)
Potassium: 3.6 mmol/L (ref 3.5–5.1)
Sodium: 135 mmol/L (ref 135–145)

## 2018-11-10 LAB — TROPONIN I (HIGH SENSITIVITY)
Troponin I (High Sensitivity): 4 ng/L (ref <18)
Troponin I (High Sensitivity): 4 ng/L (ref <18)

## 2018-11-10 MED ORDER — PANTOPRAZOLE SODIUM 40 MG PO TBEC: 40.0000 mg | DELAYED_RELEASE_TABLET | Freq: Every day | ORAL | 0 refills | Status: DC

## 2018-11-10 MED ORDER — PANTOPRAZOLE SODIUM 40 MG PO TBEC
40.0000 mg | DELAYED_RELEASE_TABLET | Freq: Once | ORAL | Status: AC
  Administered 2018-11-10: 05:00:00 40 mg via ORAL
  Filled 2018-11-10: qty 1

## 2018-11-10 NOTE — Discharge Instructions (Signed)
Your blood pressure was a little high today. Please have it checked several times in the next few days.  Return if symptoms are getting worse.

## 2018-11-10 NOTE — ED Provider Notes (Signed)
[facility] EMERGENCY DEPARTMENT Provider Note   CSN: [id number] Arrival date & time: 11/09/18  2237    History   Chief Complaint Chief Complaint  Patient presents with  . Chest Pain    HPI Donna [name] is a 73 y.o. female.   The history is provided by the patient.  Chest Pain She has history of bladder cancer, hiatus hernia and comes in because of an episode of chest discomfort.  Earlier in the afternoon, she had noted some numbness in her left arm.  While eating, she noted sense of indigestion or heartburn.  This lasted for about 3 hours before resolving.  There is no associated dyspnea, nausea, diaphoresis.  She did not take anything to treat the symptoms.  She is a non-smoker and denies history of hypertension, diabetes, hyperlipidemia.  Chest discomfort is now gone but she still has mild paresthesias of the left arm.  She denies any weakness or numbness or tingling any other location.  Past Medical History:  Diagnosis Date  . Bladder cancer [facility]) urologist-- dr Alinda Money   first dx in 2000-- papillary non-invasive , s/p multiple TURBT's w/ Mitomyocin C  . Hiatal hernia   . History of colon polyps   . Osteopenia 2020   T score -1.3 FRAX 9% / 1%  . Wears contact lenses     Patient Active Problem List   Diagnosis Date Noted  . Cancer [facility])     Past Surgical History:  Procedure Laterality Date  . COLONOSCOPY    . CT CARDIAC SCORING  03/28/2017   SCORE= 0  . CYSTOSCOPY W/ RETROGRADES  07/04/2011   Procedure: CYSTOSCOPY WITH RETROGRADE PYELOGRAM;  Surgeon: Dutch Gray, MD;  Location: [facility];  Service: Urology;  Laterality: Bilateral;  . CYSTOSCOPY W/ URETERAL STENT PLACEMENT Bilateral 05/27/2013   Procedure: CYSTOSCOPY WITH RETROGRADE PYELOGRAM/URETERAL STENT PLACEMENT LEFT;  Surgeon: Dutch Gray, MD;  Location: [facility];  Service: Urology;  Laterality: Bilateral;  . TRANSURETHRAL RESECTION OF BLADDER TUMOR  07/04/2011   Procedure: TRANSURETHRAL RESECTION OF  BLADDER TUMOR (TURBT);  Surgeon: Dutch Gray, MD;  [facility];  Service: Urology;  Laterality: N/A;  . TRANSURETHRAL RESECTION OF BLADDER TUMOR N/A 05/27/2013   Procedure: TRANSURETHRAL RESECTION OF BLADDER TUMOR (TURBT);  Surgeon: Dutch Gray, MD;  [facility];  Service: Urology;  Laterality: N/A;  . TRANSURETHRAL RESECTION OF BLADDER TUMOR Bilateral 06/05/2017   Procedure: TRANSURETHRAL RESECTION OF BLADDER TUMOR (TURBT)/ CYSTSCOPY/ BILATERAL RETROGADE [id number];  Surgeon: Raynelle Bring, MD;  [facility];  Service: Urology;  Laterality: Bilateral;  GENERAL ANESTHESIA WITH PARALYSIS  . TRANSURETHRAL RESECTION OF BLADDER TUMOR WITH MITOMYCIN-C  x2 in 2000;  x2 in 2001;  2002;  2003;  2005;  2006   dr humphries  . UPPER GASTROINTESTINAL ENDOSCOPY       OB History    Gravida  3   Para  3   Term  3   Preterm      AB      Living  3     SAB      TAB      Ectopic      Multiple      Live Births               Home Medications    Prior to Admission medications   Medication Sig Start Date End Date Taking? Authorizing Provider  cetirizine (ZYRTEC) 10 MG tablet Take 10 mg by mouth every morning.  [provider]  cholecalciferol (VITAMIN D) 1000 units tablet Take 1,000 Units by mouth daily.    [provider]    Family History Family History  Problem Relation Age of Onset  . Stomach cancer Father   . Heart attack Mother   . Cancer Brother        Prostate  . Colon cancer Neg Hx   . Esophageal cancer Neg Hx   . Colon polyps Neg Hx   . Rectal cancer Neg Hx     Social History Social History   Tobacco Use  . Smoking status: Former Smoker    Packs/day: 0.50    Years: 10.00    Pack years: 5.00    Types: Cigarettes    Quit date: 06/27/1981    Years since quitting: 37.3  . Smokeless tobacco: Never Used  Substance Use Topics  . Alcohol use: Yes    Comment: occasional  . Drug use: No     Allergies   Patient has no known  allergies.   Review of Systems Review of Systems  Cardiovascular: Positive for chest pain.  All other systems reviewed and are negative.    Physical Exam Updated Vital Signs BP (!) 149/73 (BP Location: Right Arm)   Pulse 69   Temp 97.6 F (36.4 C) (Oral)   Resp 18   SpO2 98%   Physical Exam Vitals signs and nursing note reviewed.    73 year old female, resting comfortably and in no acute distress. Vital signs are significant for elevated blood pressure. Oxygen saturation is 98%, which is normal. Head is normocephalic and atraumatic. PERRLA, EOMI. Oropharynx is clear. Neck is nontender and supple without adenopathy or JVD. Back is nontender and there is no CVA tenderness. Lungs are clear without rales, wheezes, or rhonchi. Chest is nontender. Heart has regular rate and rhythm without murmur. Abdomen is soft, flat, nontender without masses or hepatosplenomegaly and peristalsis is normoactive. Extremities have no cyanosis or edema, full range of motion is present. Skin is warm and dry without rash. Neurologic: Mental status is normal, cranial nerves are intact, there are no motor or sensory deficits.  There is no pronator drift.  There is no extinction on double simultaneous stimulation.  ED Treatments / Results  Labs (all labs ordered are listed, but only abnormal results are displayed) Labs Reviewed  BASIC METABOLIC PANEL - Abnormal; Notable for the following components:      Result Value   Calcium 8.8 (*)    All other components within normal limits  CBC - Abnormal; Notable for the following components:   WBC 3.8 (*)    All other components within normal limits  TROPONIN I (HIGH SENSITIVITY)  TROPONIN I (HIGH SENSITIVITY)    EKG EKG Interpretation  Date/Time:  Friday November 09 2018 22:50:54 EDT Ventricular Rate:  77 PR Interval:  162 QRS Duration: 72 QT Interval:  372 QTC Calculation: 420 R Axis:   27 Text Interpretation:  Sinus rhythm with Premature  atrial complexes Nonspecific ST abnormality Abnormal ECG When compared with ECG of 05/24/2013, No significant change was found Confirmed by Delora Fuel ([id number]) on 11/09/2018 11:26:52 PM   Radiology Dg Chest 2 View  Result Date: 11/09/2018 CLINICAL DATA:  Chest discomfort EXAM: CHEST - 2 VIEW COMPARISON:  None. FINDINGS: Heart and mediastinal contours are within normal limits. No focal opacities or effusions. No acute bony abnormality. IMPRESSION: No active cardiopulmonary disease. Electronically Signed   By: Rolm Baptise M.D.   On: 11/09/2018  23:22    Procedures Procedures (including critical care time)  Medications Ordered in ED Medications  sodium chloride flush (NS) 0.9 % injection 3 mL (has no administration in time range)     Initial Impression / Assessment and Plan / ED Course  I have reviewed the triage vital signs and the nursing notes.  Pertinent labs & imaging results that were available during my care of the patient were reviewed by me and considered in my medical decision making (see chart for details).  Episode of chest discomfort which most likely was gastroesophageal reflux.  ECG is unchanged from prior.  Chest x-ray is unremarkable.  Troponin is normal x2.  Heart score is 3, which puts her at low risk for major adverse cardiac events in the next 6 weeks.  She will be given a dose of pantoprazole and is referred back to PCP for further outpatient evaluation.  Return precautions discussed.  Old records are reviewed, and she has no relevant past visits.  Final Clinical Impressions(s) / ED Diagnoses   Final diagnoses:  Chest discomfort  Elevated blood-pressure reading, without diagnosis of hypertension  Left arm numbness    ED Discharge Orders    None       Delora Fuel, MD [id number]

## 2018-12-04 ENCOUNTER — Other Ambulatory Visit: Payer: Self-pay

## 2018-12-04 DIAGNOSIS — Z20822 Contact with and (suspected) exposure to covid-19: Secondary | ICD-10-CM

## 2018-12-04 DIAGNOSIS — R6889 Other general symptoms and signs: Secondary | ICD-10-CM | POA: Diagnosis not present

## 2018-12-05 LAB — NOVEL CORONAVIRUS, NAA: SARS-CoV-2, NAA: NOT DETECTED

## 2018-12-12 ENCOUNTER — Encounter: Payer: Self-pay | Admitting: Gynecology

## 2018-12-28 ENCOUNTER — Ambulatory Visit: Payer: Medicare Other | Admitting: Gastroenterology

## 2018-12-28 ENCOUNTER — Other Ambulatory Visit: Payer: Self-pay

## 2018-12-28 ENCOUNTER — Encounter: Payer: Self-pay | Admitting: Gastroenterology

## 2018-12-28 VITALS — BP 114/70 | HR 48 | Temp 98.3°F | Ht 66.0 in | Wt 165.1 lb

## 2018-12-28 DIAGNOSIS — Z1159 Encounter for screening for other viral diseases: Secondary | ICD-10-CM | POA: Diagnosis not present

## 2018-12-28 DIAGNOSIS — R05 Cough: Secondary | ICD-10-CM | POA: Diagnosis not present

## 2018-12-28 DIAGNOSIS — R0789 Other chest pain: Secondary | ICD-10-CM

## 2018-12-28 DIAGNOSIS — K219 Gastro-esophageal reflux disease without esophagitis: Secondary | ICD-10-CM

## 2018-12-28 DIAGNOSIS — R059 Cough, unspecified: Secondary | ICD-10-CM

## 2018-12-28 MED ORDER — PANTOPRAZOLE SODIUM 40 MG PO TBEC: 40.0000 mg | DELAYED_RELEASE_TABLET | Freq: Every day | ORAL | 3 refills | Status: DC

## 2018-12-28 NOTE — Progress Notes (Signed)
Donna [name]    [id number]    September 25, 1945  Primary Care Physician:Tisovec, Fransico Him, MD  Referring Physician: Haywood Pao, MD [address] [place],  [place] [address]   Chief complaint:  GERD  HPI: 73 year old with history of bladder cancer s/p resection here for follow-up visit for GERD. She was recently in the ER with atypical chest pain, cardiac work-up negative She has been having dry nagging cough, worse at bedtime and when she wakes up in the morning for the past few months, it is better since she started taking pantoprazole. Denies any dysphagia, odynophagia, nausea, vomit, abdominal pain, melena or blood per rectum.  December 29, 2015 colonoscopy: Diverticulosis and internal hemorrhoids, no polyps.  No family history of stomach cancer or colon cancer EGD July 2002: Cardia nodule biopsied, showed benign mucosa.  H. pylori test negative  EGD August 2012: Hiatal hernia, esophageal stricture dilated  Outpatient Encounter Medications as of 12/28/2018  Medication Sig  . cetirizine (ZYRTEC) 10 MG tablet Take 10 mg by mouth every morning.   . cholecalciferol (VITAMIN D) 1000 units tablet Take 1,000 Units by mouth daily.  Marland Kitchen guaifenesin (MUCUS RELIEF) 400 MG TABS tablet Take 400 mg by mouth daily.  . pantoprazole (PROTONIX) 40 MG tablet Take 1 tablet (40 mg total) by mouth daily.   Facility-Administered Encounter Medications as of 12/28/2018  Medication  . mitomycin (MUTAMYCIN) chemo injection 40 mg    Allergies as of 12/28/2018  . (No Known Allergies)    Past Medical History:  Diagnosis Date  . Bladder cancer [facility]) urologist-- dr Alinda Money   first dx in 2000-- papillary non-invasive , s/p multiple TURBT's w/ Mitomyocin C  . Hiatal hernia   . History of colon polyps   . Osteopenia 2020   T score -1.3 FRAX 9% / 1%  . Wears contact lenses     Past Surgical History:  Procedure Laterality Date  . COLONOSCOPY    . CT CARDIAC SCORING  03/28/2017   SCORE= 0  . CYSTOSCOPY W/ RETROGRADES  07/04/2011   Procedure: CYSTOSCOPY WITH RETROGRADE PYELOGRAM;  Surgeon: Dutch Gray, MD;  [facility];  Service: Urology;  Laterality: Bilateral;  . CYSTOSCOPY W/ URETERAL STENT PLACEMENT Bilateral 05/27/2013   Procedure: CYSTOSCOPY WITH RETROGRADE PYELOGRAM/URETERAL STENT PLACEMENT LEFT;  Surgeon: Dutch Gray, MD;  [facility];  Service: Urology;  Laterality: Bilateral;  . TRANSURETHRAL RESECTION OF BLADDER TUMOR  07/04/2011   Procedure: TRANSURETHRAL RESECTION OF BLADDER TUMOR (TURBT);  Surgeon: Dutch Gray, MD;  [facility];  Service: Urology;  Laterality: N/A;  . TRANSURETHRAL RESECTION OF BLADDER TUMOR N/A 05/27/2013   Procedure: TRANSURETHRAL RESECTION OF BLADDER TUMOR (TURBT);  Surgeon: Dutch Gray, MD;  [facility];  Service: Urology;  Laterality: N/A;  . TRANSURETHRAL RESECTION OF BLADDER TUMOR Bilateral 06/05/2017   Procedure: TRANSURETHRAL RESECTION OF BLADDER TUMOR (TURBT)/ CYSTSCOPY/ BILATERAL RETROGADE [id number];  Surgeon: Raynelle Bring, MD;  [facility];  Service: Urology;  Laterality: Bilateral;  GENERAL ANESTHESIA WITH PARALYSIS  . TRANSURETHRAL RESECTION OF BLADDER TUMOR WITH MITOMYCIN-C  x2 in 2000;  x2 in 2001;  2002;  2003;  2005;  2006   dr humphries  . UPPER GASTROINTESTINAL ENDOSCOPY      Family History  Problem Relation Age of Onset  . Stomach cancer Father   . Heart attack Mother   . Cancer Brother        Prostate  . Colon cancer Neg  Hx   . Esophageal cancer Neg Hx   . Colon polyps Neg Hx   . Rectal cancer Neg Hx     Social History   Socioeconomic History  . Marital status: Married    Spouse name: Not on file  . Number of children: Not on file  . Years of education: Not on file  . Highest education level: Not on file  Occupational History  . Not on file  Social Needs  . Financial resource strain: Not on file  . Food insecurity    Worry: Not on file    Inability: Not on file  . Transportation  needs    Medical: Not on file    Non-medical: Not on file  Tobacco Use  . Smoking status: Former Smoker    Packs/day: 0.50    Years: 10.00    Pack years: 5.00    Types: Cigarettes    Quit date: 06/27/1981    Years since quitting: 37.5  . Smokeless tobacco: Never Used  Substance and Sexual Activity  . Alcohol use: Yes    Comment: occasional  . Drug use: No  . Sexual activity: Yes    Birth control/protection: Post-menopausal    Comment: 1st intercourse 73 yo-Fewer than 5 partners  Lifestyle  . Physical activity    Days per week: Not on file    Minutes per session: Not on file  . Stress: Not on file  Relationships  . Social Herbalist on phone: Not on file    Gets together: Not on file    Attends religious service: Not on file    Active member of club or organization: Not on file    Attends meetings of clubs or organizations: Not on file    Relationship status: Not on file  . Intimate partner violence    Fear of current or ex partner: Not on file    Emotionally abused: Not on file    Physically abused: Not on file    Forced sexual activity: Not on file  Other Topics Concern  . Not on file  Social History Narrative  . Not on file      Review of systems: Review of Systems  Constitutional: Negative for fever and chills.  HENT: Negative.   Eyes: Negative for blurred vision.  Respiratory: Negative for cough, shortness of breath and wheezing.   Cardiovascular: Negative for chest pain and palpitations.  Gastrointestinal: as per HPI Genitourinary: Negative for dysuria, urgency, frequency and hematuria.  Musculoskeletal: Negative for myalgias, back pain and joint pain.  Positive for shoulder pain Skin: Negative for itching and rash.  Neurological: Negative for dizziness, tremors, focal weakness, seizures and loss of consciousness.  Endo/Heme/Allergies: Positive for seasonal allergies.  Psychiatric/Behavioral: Negative for depression, suicidal ideas and  hallucinations.  All other systems reviewed and are negative.   Physical Exam: Vitals:   12/28/18 1045  BP: 114/70  Pulse: (!) 48  Temp: 98.3 F (36.8 C)   Body mass index is 26.65 kg/m. Gen:      No acute distress HEENT:  EOMI, sclera anicteric Neck:     No masses; no thyromegaly Lungs:    Clear to auscultation bilaterally; normal respiratory effort CV:         Regular rate and rhythm; no murmurs Abd:      + bowel sounds; soft, non-tender; no palpable masses, no distension Ext:    No edema; adequate peripheral perfusion Skin:      Warm and  dry; no rash Neuro: alert and oriented x 3 Psych: normal mood and affect  Data Reviewed:  Reviewed labs, radiology imaging, old records and pertinent past GI work up   Assessment and Plan/Recommendations:  73 year old female with history of bladder cancer s/p resection, hiatal hernia, GERD with atypical chest pain and dry cough  Continue Protonix 40 mg daily Discussed antireflux measures and lifestyle modification  We will schedule for EGD to exclude esophagitis/gastritis or neoplastic lesion given recent exacerbation of GERD symptoms  Return in 1 year or sooner if needed  The risks and benefits as well as alternatives of endoscopic procedure(s) have been discussed and reviewed. All questions answered. The patient agrees to proceed.   Damaris Hippo , MD    CC: Tisovec, Fransico Him, MD

## 2018-12-28 NOTE — Patient Instructions (Addendum)
Due to recent COVID-19 restrictions implemented by [employer] and state authorities and in an effort to keep both patients and staff as safe as possible, [place] requires COVID-19 testing prior to any scheduled endoscopic procedure. The testing center is located at [address] [place], [place] [address] in the [facility] [employer]  suite.  Your appointment has been scheduled for 10:20am on 01/11/2019.   Please bring your insurance cards to this appointment. You will require your COVID screen 2 business days prior to your endoscopic procedure.  You are not required to quarantine after your screening.  You will only receive a phone call with the results if it is POSITIVE.  If you do not receive a call the day before your procedure you should begin your prep, if ordered, and you should report to the endo center for your procedure at your designated appointment arrival time ( one hour prior to the procedure time). There is no cost to you for the screening on the day of the swab.  [facility] will file with your insurance company for the testing.    You may receive an automated phone call prior to your procedure or have a message in your MyChart that you have an appointment for a BP/15 at the [facility], please disregard this message.  Your testing will be at the [place] [place] location.   If you are leaving [facility] travel [place] on [address], turn left onto [address], turn night onto [address] at the 1st stop light turn right, pass the [employer] on your right and proceed to [place] (white building).   You have been scheduled for an endoscopy. Please follow written instructions given to you at your visit today. If you use inhalers (even only as needed), please bring them with you on the day of your procedure.   We have  sent refills of Protonix to your pharmacy   Gastroesophageal Reflux Disease, Adult Gastroesophageal reflux (GER) happens when acid from the stomach flows up into the tube that connects the mouth and the stomach (esophagus). Normally, food travels down the esophagus and stays in the stomach to be digested. However, when a person has GER, food and stomach acid sometimes move back up into the esophagus. If this becomes a more serious problem, the person may be diagnosed with a disease called gastroesophageal reflux disease (GERD). GERD occurs when the reflux:  Happens often.  Causes frequent or severe symptoms.  Causes problems such as damage to the esophagus. When stomach acid comes in contact with the esophagus, the acid may cause soreness (inflammation) in the esophagus. Over time, GERD may create small holes (ulcers) in the lining of the esophagus. What are the causes? This condition is caused by a problem with the muscle between the esophagus and the stomach (lower esophageal sphincter, or LES). Normally, the LES muscle closes after food passes through the esophagus to the stomach. When the LES is weakened or abnormal, it does not close properly, and that allows food and stomach acid to go back up into the esophagus. The LES can be weakened by certain dietary substances, medicines, and medical conditions, including:  Tobacco use.  Pregnancy.  Having a hiatal hernia.  Alcohol use.  Certain foods and beverages, such as coffee, chocolate, onions, and peppermint. What increases the risk? You are more likely to develop this condition if you:  Have  an increased body weight.  Have a connective tissue disorder.  Use NSAID medicines. What are the signs or symptoms? Symptoms of this condition include:  Heartburn.  Difficult or painful swallowing.  The feeling of having a lump in the throat.  Abitter taste in the mouth.  Bad breath.  Having a large amount of saliva.  Having an  upset or bloated stomach.  Belching.  Chest pain. Different conditions can cause chest pain. Make sure you see your health care provider if you experience chest pain.  Shortness of breath or wheezing.  Ongoing (chronic) cough or a night-time cough.  Wearing away of tooth enamel.  Weight loss. How is this diagnosed? Your health care provider will take a medical history and perform a physical exam. To determine if you have mild or severe GERD, your health care provider may also monitor how you respond to treatment. You may also have tests, including:  A test to examine your stomach and esophagus with a small camera (endoscopy).  A test thatmeasures the acidity level in your esophagus.  A test thatmeasures how much pressure is on your esophagus.  A barium swallow or modified barium swallow test to show the shape, size, and functioning of your esophagus. How is this treated? The goal of treatment is to help relieve your symptoms and to prevent complications. Treatment for this condition may vary depending on how severe your symptoms are. Your health care provider may recommend:  Changes to your diet.  Medicine.  Surgery. Follow these instructions at home: Eating and drinking   Follow a diet as recommended by your health care provider. This may involve avoiding foods and drinks such as: ? Coffee and tea (with or without caffeine). ? Drinks that containalcohol. ? Energy drinks and sports drinks. ? Carbonated drinks or sodas. ? Chocolate and cocoa. ? Peppermint and mint flavorings. ? Garlic and onions. ? Horseradish. ? Spicy and acidic foods, including peppers, chili powder, curry powder, vinegar, hot sauces, and barbecue sauce. ? Citrus fruit juices and citrus fruits, such as oranges, lemons, and limes. ? Tomato-based foods, such as red sauce, chili, salsa, and pizza with red sauce. ? Fried and fatty foods, such as donuts, french fries, potato chips, and high-fat  dressings. ? High-fat meats, such as hot dogs and fatty cuts of red and white meats, such as rib eye steak, sausage, ham, and bacon. ? High-fat dairy items, such as whole milk, butter, and cream cheese.  Eat small, frequent meals instead of large meals.  Avoid drinking large amounts of liquid with your meals.  Avoid eating meals during the 2-3 hours before bedtime.  Avoid lying down right after you eat.  Do not exercise right after you eat. Lifestyle   Do not use any products that contain nicotine or tobacco, such as cigarettes, e-cigarettes, and chewing tobacco. If you need help quitting, ask your health care provider.  Try to reduce your stress by using methods such as yoga or meditation. If you need help reducing stress, ask your health care provider.  If you are overweight, reduce your weight to an amount that is healthy for you. Ask your health care provider for guidance about a safe weight loss goal. General instructions  Pay attention to any changes in your symptoms.  Take over-the-counter and prescription medicines only as told by your health care provider. Do not take aspirin, ibuprofen, or other NSAIDs unless your health care provider told you to do so.  Wear loose-fitting clothing. Do not wear  anything tight around your waist that causes pressure on your abdomen.  Raise (elevate) the head of your bed about 6 inches (15 cm).  Avoid bending over if this makes your symptoms worse.  Keep all follow-up visits as told by your health care provider. This is important. Contact a health care provider if:  You have: ? New symptoms. ? Unexplained weight loss. ? Difficulty swallowing or it hurts to swallow. ? Wheezing or a persistent cough. ? A hoarse voice.  Your symptoms do not improve with treatment. Get help right away if you:  Have pain in your arms, neck, jaw, teeth, or back.  Feel sweaty, dizzy, or light-headed.  Have chest pain or shortness of breath.  Vomit  and your vomit looks like blood or coffee grounds.  Faint.  Have stool that is bloody or black.  Cannot swallow, drink, or eat. Summary  Gastroesophageal reflux happens when acid from the stomach flows up into the esophagus. GERD is a disease in which the reflux happens often, causes frequent or severe symptoms, or causes problems such as damage to the esophagus.  Treatment for this condition may vary depending on how severe your symptoms are. Your health care provider may recommend diet and lifestyle changes, medicine, or surgery.  Contact a health care provider if you have new or worsening symptoms.  Take over-the-counter and prescription medicines only as told by your health care provider. Do not take aspirin, ibuprofen, or other NSAIDs unless your health care provider told you to do so.  Keep all follow-up visits as told by your health care provider. This is important. This information is not intended to replace advice given to you by your health care provider. Make sure you discuss any questions you have with your health care provider. Document Released: 12/01/2004 Document Revised: 08/30/2017 Document Reviewed: 08/30/2017 Elsevier Patient Education  [place].  I appreciate the  opportunity to care for you  Thank You   Harl Bowie , MD

## 2018-12-31 DIAGNOSIS — Z0120 Encounter for dental examination and cleaning without abnormal findings: Secondary | ICD-10-CM | POA: Diagnosis not present

## 2019-01-03 ENCOUNTER — Encounter: Payer: Self-pay | Admitting: Gastroenterology

## 2019-01-11 ENCOUNTER — Encounter: Payer: Self-pay | Admitting: Gastroenterology

## 2019-01-11 ENCOUNTER — Other Ambulatory Visit: Payer: Self-pay | Admitting: Gastroenterology

## 2019-01-11 DIAGNOSIS — Z1159 Encounter for screening for other viral diseases: Secondary | ICD-10-CM | POA: Diagnosis not present

## 2019-01-11 LAB — SARS CORONAVIRUS 2 (TAT 6-24 HRS): SARS Coronavirus 2: NEGATIVE

## 2019-01-15 ENCOUNTER — Encounter: Payer: Self-pay | Admitting: Gastroenterology

## 2019-01-15 ENCOUNTER — Ambulatory Visit (AMBULATORY_SURGERY_CENTER): Payer: Medicare Other | Admitting: Gastroenterology

## 2019-01-15 ENCOUNTER — Other Ambulatory Visit: Payer: Self-pay

## 2019-01-15 VITALS — BP 117/65 | HR 78 | Temp 98.1°F | Resp 17 | Ht 66.0 in | Wt 165.0 lb

## 2019-01-15 DIAGNOSIS — K222 Esophageal obstruction: Secondary | ICD-10-CM | POA: Diagnosis not present

## 2019-01-15 DIAGNOSIS — K2990 Gastroduodenitis, unspecified, without bleeding: Secondary | ICD-10-CM | POA: Diagnosis not present

## 2019-01-15 DIAGNOSIS — K2100 Gastro-esophageal reflux disease with esophagitis, without bleeding: Secondary | ICD-10-CM | POA: Diagnosis not present

## 2019-01-15 DIAGNOSIS — K219 Gastro-esophageal reflux disease without esophagitis: Secondary | ICD-10-CM | POA: Diagnosis not present

## 2019-01-15 DIAGNOSIS — K449 Diaphragmatic hernia without obstruction or gangrene: Secondary | ICD-10-CM

## 2019-01-15 DIAGNOSIS — K2970 Gastritis, unspecified, without bleeding: Secondary | ICD-10-CM

## 2019-01-15 MED ORDER — SODIUM CHLORIDE 0.9 % IV SOLN: 500.0000 mL | Freq: Once | INTRAVENOUS | Status: DC

## 2019-01-15 NOTE — Progress Notes (Signed)
Pt's states no medical or surgical changes since previsit or office visit.  JB - temp CW - vitals. 

## 2019-01-15 NOTE — Progress Notes (Signed)
Pt tolerated well. VSS. Arousable and to recovery.

## 2019-01-15 NOTE — Patient Instructions (Signed)
HANDOUTS PROVIDED ON: HIATAL HERNIA & GASTRITIS   THE BIOPSIES TAKEN TODAY HAVE BEEN SENT FOR PATHOLOGY.  THE RESULTS CAN TAKE 2-3 WEEKS TO RECEIVE.    YOU MAY RESUME YOUR PREVIOUS DIET AND MEDICATION SCHEDULE.  YOU WILL NEED TO FOLLOW THE ANTIREFLUX REGIMEN.  CONTINUE TO AVOID IBUPROFEN, NAPROXEN, AND NSAIDS.  RETURN TO THE GI OFFICE IN 1 YEAR.  [place] YOU FOR ALLOWING [place] TO CARE FOR YOU TODAY!!!  YOU HAD AN ENDOSCOPIC PROCEDURE TODAY AT [facility]:   Refer to the procedure report that was given to you for any specific questions about what was found during the examination.  If the procedure report does not answer your questions, please call your gastroenterologist to clarify.  If you requested that your care partner not be given the details of your procedure findings, then the procedure report has been included in a sealed envelope for you to review at your convenience later.  YOU SHOULD EXPECT: Some feelings of bloating in the abdomen. Passage of more gas than usual.  Walking can help get rid of the air that was put into your GI tract during the procedure and reduce the bloating. If you had a lower endoscopy (such as a colonoscopy or flexible sigmoidoscopy) you may notice spotting of blood in your stool or on the toilet paper. If you underwent a bowel prep for your procedure, you may not have a normal bowel movement for a few days.  Please Note:  You might notice some irritation and congestion in your nose or some drainage.  This is from the oxygen used during your procedure.  There is no need for concern and it should clear up in a day or so.  SYMPTOMS TO REPORT IMMEDIATELY:   Following upper endoscopy (EGD)  Vomiting of blood or coffee ground material  New chest pain or pain under the shoulder blades  Painful or persistently difficult swallowing  New shortness of breath  Fever of 100F or higher  Black, tarry-looking stools  For urgent or emergent issues, a  gastroenterologist can be reached at any hour by calling [phone].   DIET:  We do recommend a small meal at first, but then you may proceed to your regular diet.  Drink plenty of fluids but you should avoid alcoholic beverages for 24 hours.  ACTIVITY:  You should plan to take it easy for the rest of today and you should NOT DRIVE or use heavy machinery until tomorrow (because of the sedation medicines used during the test).    FOLLOW UP: Our staff will call the number listed on your records 48-72 hours following your procedure to check on you and address any questions or concerns that you may have regarding the information given to you following your procedure. If we do not reach you, we will leave a message.  We will attempt to reach you two times.  During this call, we will ask if you have developed any symptoms of COVID 19. If you develop any symptoms (ie: fever, flu-like symptoms, shortness of breath, cough etc.) before then, please call [phone].  If you test positive for Covid 19 in the 2 weeks post procedure, please call and report this information to [place].    If any biopsies were taken you will be contacted by phone or by letter within the next 1-3 weeks.  Please call us at [phone] if you have not heard about the biopsies in 3 weeks.    SIGNATURES/CONFIDENTIALITY: You and/or  your care partner have signed paperwork which will be entered into your electronic medical record.  These signatures attest to the fact that that the information above on your After Visit Summary has been reviewed and is understood.  Full responsibility of the confidentiality of this discharge information lies with you and/or your care-partner.

## 2019-01-15 NOTE — Op Note (Signed)
[facility] Patient Name: Donna [name] Procedure Date: 01/15/2019 9:50 AM MRN: [id number] Endoscopist: Mauri Pole , MD Age: 73 Referring MD:  Date of Birth: 02/14/1946 Gender: Female Account #: [id number] Procedure:                Upper GI endoscopy Indications:              Esophageal reflux symptoms that recur despite                            appropriate therapy Medicines:                Monitored Anesthesia Care Procedure:                Pre-Anesthesia Assessment:                           - Prior to the procedure, a History and Physical                            was performed, and patient medications and                            allergies were reviewed. The patient's tolerance of                            previous anesthesia was also reviewed. The risks                            and benefits of the procedure and the sedation                            options and risks were discussed with the patient.                            All questions were answered, and informed consent                            was obtained. Prior Anticoagulants: The patient has                            taken no previous anticoagulant or antiplatelet                            agents. ASA Grade Assessment: II - A patient with                            mild systemic disease. After reviewing the risks                            and benefits, the patient was deemed in                            satisfactory condition to undergo the procedure.  After obtaining informed consent, the endoscope was                            passed under direct vision. Throughout the                            procedure, the patient's blood pressure, pulse, and                            oxygen saturations were monitored continuously. The                            Endoscope was introduced through the mouth, and                            advanced to the second part of  duodenum. The upper                            GI endoscopy was accomplished without difficulty.                            The patient tolerated the procedure well. Scope In: Scope Out: Findings:                 LA Grade A (one or more mucosal breaks less than 5                            mm, not extending between tops of 2 mucosal folds)                            esophagitis with no bleeding was found 34 to 35 cm                            from the incisors.                           One benign-appearing, intrinsic mild                            (non-circumferential scarring) stenosis was found                            35 to 36 cm from the incisors. This stenosis                            measured greater than or equal to 3 cm (inner                            diameter) x less than one cm (in length). The                            stenosis was traversed.  No gross lesions were noted in the entire esophagus.                           A small hiatal hernia was present.                           Patchy mild inflammation characterized by erythema                            was found in the gastric antrum and in the                            prepyloric region of the stomach. Biopsies were                            taken with a cold forceps for Helicobacter pylori                            testing.                           The examined duodenum was normal. Complications:            No immediate complications. Estimated Blood Loss:     Estimated blood loss was minimal. Impression:               - LA Grade A reflux esophagitis with no bleeding.                           - Benign-appearing esophageal stenosis.                           - No gross lesions in esophagus.                           - Small hiatal hernia.                           - Gastritis. Biopsied.                           - Normal examined duodenum. Recommendation:           - Patient has a  contact number available for                            emergencies. The signs and symptoms of potential                            delayed complications were discussed with the                            patient. Return to normal activities tomorrow.                            Written discharge instructions were provided to the  patient.                           - Resume previous diet.                           - Continue present medications.                           - Await pathology results.                           - No ibuprofen, naproxen, or other non-steroidal                            anti-inflammatory drugs.                           - Follow an antireflux regimen indefinitely.                           - Return to GI office in 1 year. Mauri Pole, MD 01/15/2019 10:05:41 AM This report has been signed electronically.

## 2019-01-15 NOTE — Progress Notes (Signed)
Called to room to assist during endoscopic procedure.  Patient ID and intended procedure confirmed with present staff. Received instructions for my participation in the procedure from the performing physician.  

## 2019-01-16 ENCOUNTER — Other Ambulatory Visit: Payer: Self-pay

## 2019-01-16 DIAGNOSIS — Z20822 Contact with and (suspected) exposure to covid-19: Secondary | ICD-10-CM

## 2019-01-17 ENCOUNTER — Telehealth: Payer: Self-pay | Admitting: *Deleted

## 2019-01-17 DIAGNOSIS — H5203 Hypermetropia, bilateral: Secondary | ICD-10-CM | POA: Diagnosis not present

## 2019-01-17 NOTE — Telephone Encounter (Signed)
  Follow up Call-  Call back number 01/15/2019  Post procedure Call Back phone  # [phone]  Permission to leave phone message Yes  Some recent data might be hidden     Patient questions:  Do you have a fever, pain , or abdominal swelling? No. Pain Score  0 *  Have you tolerated food without any problems? Yes.    Have you been able to return to your normal activities? Yes.    Do you have any questions about your discharge instructions: Diet   No. Medications  No. Follow up visit  No.  Do you have questions or concerns about your Care? No.  Actions: * If pain score is 4 or above: No action needed, pain <4.    1. Have you developed a fever since your procedure? no  2.   Have you had an respiratory symptoms (SOB or cough) since your procedure? no  3.   Have you tested positive for COVID 19 since your procedure no  4.   Have you had any family members/close contacts diagnosed with the COVID 19 since your procedure?  no   If yes to any of these questions please route to Joylene John, RN and Alphonsa Gin, Therapist, sports.

## 2019-01-18 LAB — NOVEL CORONAVIRUS, NAA: SARS-CoV-2, NAA: NOT DETECTED

## 2019-01-22 ENCOUNTER — Encounter: Payer: Self-pay | Admitting: Gastroenterology

## 2019-02-18 DIAGNOSIS — L7211 Pilar cyst: Secondary | ICD-10-CM | POA: Diagnosis not present

## 2019-02-18 DIAGNOSIS — D1801 Hemangioma of skin and subcutaneous tissue: Secondary | ICD-10-CM | POA: Diagnosis not present

## 2019-02-18 DIAGNOSIS — L812 Freckles: Secondary | ICD-10-CM | POA: Diagnosis not present

## 2019-02-18 DIAGNOSIS — Z85828 Personal history of other malignant neoplasm of skin: Secondary | ICD-10-CM | POA: Diagnosis not present

## 2019-03-26 DIAGNOSIS — E7800 Pure hypercholesterolemia, unspecified: Secondary | ICD-10-CM | POA: Diagnosis not present

## 2019-03-26 DIAGNOSIS — M859 Disorder of bone density and structure, unspecified: Secondary | ICD-10-CM | POA: Diagnosis not present

## 2019-03-27 ENCOUNTER — Ambulatory Visit: Payer: Medicare Other | Attending: Internal Medicine

## 2019-03-27 DIAGNOSIS — Z23 Encounter for immunization: Secondary | ICD-10-CM | POA: Insufficient documentation

## 2019-03-27 NOTE — Progress Notes (Signed)
   Covid-19 Vaccination Clinic  Name:  Donna [name]    MRN: [id number] DOB: 14-Jan-1946  03/27/2019  Donna [name] was observed post Covid-19 immunization for 15 minutes without incidence. She was provided with Vaccine Information Sheet and instruction to access the V-Safe system.   Donna [name] was instructed to call 911 with any severe reactions post vaccine: Marland Kitchen Difficulty breathing  . Swelling of your face and throat  . A fast heartbeat  . A bad rash all over your body  . Dizziness and weakness    Immunizations Administered    Name Date Dose VIS Date Route   Pfizer COVID-19 Vaccine 03/27/2019  5:50 PM 0.3 mL 02/15/2019 Intramuscular   Manufacturer: [place]   Lot: [id number]   [place]: [id number]

## 2019-04-02 ENCOUNTER — Ambulatory Visit: Payer: Medicare Other

## 2019-04-02 DIAGNOSIS — R82998 Other abnormal findings in urine: Secondary | ICD-10-CM | POA: Diagnosis not present

## 2019-04-14 ENCOUNTER — Ambulatory Visit: Payer: Medicare Other | Attending: Internal Medicine

## 2019-04-14 DIAGNOSIS — Z23 Encounter for immunization: Secondary | ICD-10-CM | POA: Insufficient documentation

## 2019-04-14 NOTE — Progress Notes (Signed)
   Covid-19 Vaccination Clinic  Name:  Donna [name]    MRN: [id number] DOB: 07/15/1945  04/14/2019  [name] was observed post Covid-19 immunization for 15 minutes without incidence. She was provided with Vaccine Information Sheet and instruction to access the V-Safe system.   [name] was instructed to call 911 with any severe reactions post vaccine: Marland Kitchen Difficulty breathing  . Swelling of your face and throat  . A fast heartbeat  . A bad rash all over your body  . Dizziness and weakness    Immunizations Administered    Name Date Dose VIS Date Route   Pfizer COVID-19 Vaccine 04/14/2019  8:13 AM 0.3 mL 02/15/2019 Intramuscular   Manufacturer: [place]   Lot: [id number]   [place]: [id number]

## 2019-04-15 DIAGNOSIS — Z1212 Encounter for screening for malignant neoplasm of rectum: Secondary | ICD-10-CM | POA: Diagnosis not present

## 2019-05-09 ENCOUNTER — Ambulatory Visit: Payer: Medicare Other | Attending: Internal Medicine

## 2019-05-09 DIAGNOSIS — Z20822 Contact with and (suspected) exposure to covid-19: Secondary | ICD-10-CM | POA: Diagnosis not present

## 2019-05-10 LAB — NOVEL CORONAVIRUS, NAA: SARS-CoV-2, NAA: NOT DETECTED

## 2019-07-03 DIAGNOSIS — Z8551 Personal history of malignant neoplasm of bladder: Secondary | ICD-10-CM | POA: Diagnosis not present

## 2019-08-02 DIAGNOSIS — R21 Rash and other nonspecific skin eruption: Secondary | ICD-10-CM | POA: Diagnosis not present

## 2019-08-02 DIAGNOSIS — W57XXXA Bitten or stung by nonvenomous insect and other nonvenomous arthropods, initial encounter: Secondary | ICD-10-CM | POA: Diagnosis not present

## 2019-09-12 DIAGNOSIS — Z0120 Encounter for dental examination and cleaning without abnormal findings: Secondary | ICD-10-CM | POA: Diagnosis not present

## 2019-09-26 ENCOUNTER — Other Ambulatory Visit: Payer: Self-pay | Admitting: Internal Medicine

## 2019-09-26 DIAGNOSIS — Z1231 Encounter for screening mammogram for malignant neoplasm of breast: Secondary | ICD-10-CM

## 2019-10-18 ENCOUNTER — Ambulatory Visit
Admission: RE | Admit: 2019-10-18 | Discharge: 2019-10-18 | Disposition: A | Discharge status: Home / Self Care | Payer: Medicare Other | Source: Ambulatory Visit | Attending: Internal Medicine | Admitting: Internal Medicine

## 2019-10-18 ENCOUNTER — Other Ambulatory Visit: Payer: Self-pay

## 2019-10-18 DIAGNOSIS — Z1231 Encounter for screening mammogram for malignant neoplasm of breast: Secondary | ICD-10-CM

## 2019-11-07 ENCOUNTER — Encounter: Payer: Medicare Other | Admitting: Obstetrics and Gynecology

## 2019-11-18 DIAGNOSIS — J4520 Mild intermittent asthma, uncomplicated: Secondary | ICD-10-CM | POA: Diagnosis not present

## 2019-11-18 DIAGNOSIS — R05 Cough: Secondary | ICD-10-CM | POA: Diagnosis not present

## 2019-11-18 DIAGNOSIS — K219 Gastro-esophageal reflux disease without esophagitis: Secondary | ICD-10-CM | POA: Diagnosis not present

## 2019-11-20 ENCOUNTER — Encounter: Payer: Self-pay | Admitting: Obstetrics and Gynecology

## 2019-11-20 ENCOUNTER — Other Ambulatory Visit: Payer: Self-pay

## 2019-11-20 ENCOUNTER — Ambulatory Visit (INDEPENDENT_AMBULATORY_CARE_PROVIDER_SITE_OTHER): Payer: Medicare Other | Admitting: Obstetrics and Gynecology

## 2019-11-20 VITALS — BP 118/76 | Ht 66.0 in | Wt 160.0 lb

## 2019-11-20 DIAGNOSIS — Z01419 Encounter for gynecological examination (general) (routine) without abnormal findings: Secondary | ICD-10-CM | POA: Diagnosis not present

## 2019-11-20 NOTE — Progress Notes (Signed)
   Xochilth Standish [birth year] [id number]  SUBJECTIVE:  74 y.o. G3P3003 female here for a breast and pelvic exam. She has no gynecologic concerns.  Current Outpatient Medications  Medication Sig Dispense Refill  . cetirizine (ZYRTEC) 10 MG tablet Take 10 mg by mouth every morning.     . cholecalciferol (VITAMIN D) 1000 units tablet Take 1,000 Units by mouth daily.    . fluticasone (FLONASE) 50 MCG/ACT nasal spray Place into both nostrils daily.    Marland Kitchen guaifenesin (MUCUS RELIEF) 400 MG TABS tablet Take 400 mg by mouth daily.    . pantoprazole (PROTONIX) 40 MG tablet Take 1 tablet (40 mg total) by mouth daily. 30 tablet 0   No current facility-administered medications for this visit.   Facility-Administered Medications Ordered in Other Visits  Medication Dose Route Frequency Provider Last Rate Last Admin  . mitomycin (MUTAMYCIN) chemo injection 40 mg  40 mg Bladder Instillation Once Raynelle Bring, MD       Allergies: Patient has no known allergies.  No LMP recorded. Patient is postmenopausal.  Past medical history,surgical history, problem list, medications, allergies, family history and social history were all reviewed and documented as reviewed in the EPIC chart.  GYN ROS: no abnormal bleeding, pelvic pain or discharge, no breast pain or new or enlarging lumps on self exam.  No dysuria, urinary frequency, pain with urination, cloudy/malodorous urine.   OBJECTIVE:  BP 118/76   Ht 5\' 6"  (1.676 m)   Wt 160 lb (72.6 kg)   BMI 25.82 kg/m  The patient appears well, alert, oriented, in no distress.  BREAST EXAM: breasts appear normal, no suspicious masses, no skin or nipple changes or axillary nodes  PELVIC EXAM: VULVA: normal appearing vulva with atrophic change, no masses, tenderness or lesions, VAGINA: normal appearing vagina with normal color and discharge, trophic change, no lesions, CERVIX: normal appearing cervix without discharge or lesions, UTERUS: uterus is normal size,  shape, consistency and nontender, ADNEXA: normal adnexa in size, nontender and no masses  Chaperone: Aurora Mask (DNP student) present during the examination and performed the pelvic exam with me in attendance to confirm the exam findings   ASSESSMENT:  74 y.o. [id number] here for a breast and pelvic exam  PLAN:   1. Postmenopausal.  No significant hot flashes or night sweats.  No vaginal bleeding. 2. Pap smear 2019.  No significant history of abnormal Pap smears.  Discussed option to stop screening based on current screening criteria guidelines and will readdress at annual visit. 3. Mammogram 10/2019.  Normal breast exam today.  She will continue with annual mammograms. 4. Colonoscopy 2017.  She will follow up at the interval recommended by her GI specialist.   5. DEXA 2020.  Follows with Dr. Osborne Casco for bone health. 6. Health maintenance.  No labs today as she normally has these completed elsewhere.  Return annually or sooner, prn.  Joseph Pierini MD 11/20/19

## 2020-01-01 DIAGNOSIS — Z23 Encounter for immunization: Secondary | ICD-10-CM | POA: Diagnosis not present

## 2020-01-20 ENCOUNTER — Telehealth: Payer: Self-pay | Admitting: Gastroenterology

## 2020-01-20 NOTE — Telephone Encounter (Signed)
Ok she can try to stop it taking daily and use as needed. Thanks

## 2020-01-20 NOTE — Telephone Encounter (Signed)
Can she stop it and see how she does?

## 2020-01-20 NOTE — Telephone Encounter (Signed)
Patient is advised. Understands that if she begins having problems, we will be happy to assist her with deciding on the right course of action.

## 2020-01-23 DIAGNOSIS — H5203 Hypermetropia, bilateral: Secondary | ICD-10-CM | POA: Diagnosis not present

## 2020-02-24 ENCOUNTER — Telehealth: Payer: Self-pay | Admitting: Gastroenterology

## 2020-02-24 MED ORDER — PANTOPRAZOLE SODIUM 40 MG PO TBEC: 40.0000 mg | DELAYED_RELEASE_TABLET | Freq: Every day | ORAL | 2 refills | Status: DC

## 2020-02-24 NOTE — Telephone Encounter (Signed)
Patient states she attempted to come off pantoprazole but recently noticed more and more reflux. Patient reports it could be because she is eating bad during the holidays. Patient states she was going out of town and wanted a refill of pantoprazole. Prescription sent to patients pharmacy.

## 2020-02-25 DIAGNOSIS — Z85828 Personal history of other malignant neoplasm of skin: Secondary | ICD-10-CM | POA: Diagnosis not present

## 2020-02-25 DIAGNOSIS — L821 Other seborrheic keratosis: Secondary | ICD-10-CM | POA: Diagnosis not present

## 2020-02-25 DIAGNOSIS — D1801 Hemangioma of skin and subcutaneous tissue: Secondary | ICD-10-CM | POA: Diagnosis not present

## 2020-02-25 DIAGNOSIS — D2272 Melanocytic nevi of left lower limb, including hip: Secondary | ICD-10-CM | POA: Diagnosis not present

## 2020-03-09 DIAGNOSIS — M25551 Pain in right hip: Secondary | ICD-10-CM | POA: Diagnosis not present

## 2020-03-09 DIAGNOSIS — M7061 Trochanteric bursitis, right hip: Secondary | ICD-10-CM | POA: Diagnosis not present

## 2020-03-30 DIAGNOSIS — C679 Malignant neoplasm of bladder, unspecified: Secondary | ICD-10-CM | POA: Diagnosis not present

## 2020-03-30 DIAGNOSIS — E559 Vitamin D deficiency, unspecified: Secondary | ICD-10-CM | POA: Diagnosis not present

## 2020-03-30 DIAGNOSIS — M8589 Other specified disorders of bone density and structure, multiple sites: Secondary | ICD-10-CM | POA: Diagnosis not present

## 2020-03-30 DIAGNOSIS — E7800 Pure hypercholesterolemia, unspecified: Secondary | ICD-10-CM | POA: Diagnosis not present

## 2020-03-30 DIAGNOSIS — M542 Cervicalgia: Secondary | ICD-10-CM | POA: Diagnosis not present

## 2020-04-02 DIAGNOSIS — Z0000 Encounter for general adult medical examination without abnormal findings: Secondary | ICD-10-CM | POA: Diagnosis not present

## 2020-04-02 DIAGNOSIS — E7800 Pure hypercholesterolemia, unspecified: Secondary | ICD-10-CM | POA: Diagnosis not present

## 2020-04-02 DIAGNOSIS — Z1212 Encounter for screening for malignant neoplasm of rectum: Secondary | ICD-10-CM | POA: Diagnosis not present

## 2020-04-02 DIAGNOSIS — I491 Atrial premature depolarization: Secondary | ICD-10-CM | POA: Diagnosis not present

## 2020-04-02 DIAGNOSIS — C679 Malignant neoplasm of bladder, unspecified: Secondary | ICD-10-CM | POA: Diagnosis not present

## 2020-04-24 DIAGNOSIS — M7061 Trochanteric bursitis, right hip: Secondary | ICD-10-CM | POA: Diagnosis not present

## 2020-05-03 ENCOUNTER — Other Ambulatory Visit: Payer: Self-pay | Admitting: Gastroenterology

## 2020-05-12 DIAGNOSIS — Z85828 Personal history of other malignant neoplasm of skin: Secondary | ICD-10-CM | POA: Diagnosis not present

## 2020-05-12 DIAGNOSIS — L720 Epidermal cyst: Secondary | ICD-10-CM | POA: Diagnosis not present

## 2020-07-03 DIAGNOSIS — Z8551 Personal history of malignant neoplasm of bladder: Secondary | ICD-10-CM | POA: Diagnosis not present

## 2020-09-03 ENCOUNTER — Other Ambulatory Visit: Payer: Self-pay | Admitting: Internal Medicine

## 2020-09-03 DIAGNOSIS — Z1231 Encounter for screening mammogram for malignant neoplasm of breast: Secondary | ICD-10-CM

## 2020-10-10 ENCOUNTER — Other Ambulatory Visit: Payer: Self-pay | Admitting: Gastroenterology

## 2020-10-20 DIAGNOSIS — M7061 Trochanteric bursitis, right hip: Secondary | ICD-10-CM | POA: Diagnosis not present

## 2020-10-26 ENCOUNTER — Other Ambulatory Visit: Payer: Self-pay

## 2020-10-26 ENCOUNTER — Ambulatory Visit
Admission: RE | Admit: 2020-10-26 | Discharge: 2020-10-26 | Disposition: A | Discharge status: Home / Self Care | Payer: Medicare Other | Source: Ambulatory Visit | Attending: Internal Medicine | Admitting: Internal Medicine

## 2020-10-26 DIAGNOSIS — Z1231 Encounter for screening mammogram for malignant neoplasm of breast: Secondary | ICD-10-CM

## 2020-10-28 ENCOUNTER — Ambulatory Visit: Payer: Medicare Other

## 2020-11-26 ENCOUNTER — Encounter: Payer: Self-pay | Admitting: Obstetrics & Gynecology

## 2020-11-26 ENCOUNTER — Encounter: Payer: Medicare Other | Admitting: Obstetrics and Gynecology

## 2020-11-26 ENCOUNTER — Ambulatory Visit (INDEPENDENT_AMBULATORY_CARE_PROVIDER_SITE_OTHER): Payer: Medicare Other | Admitting: Obstetrics & Gynecology

## 2020-11-26 ENCOUNTER — Other Ambulatory Visit: Payer: Self-pay

## 2020-11-26 VITALS — BP 114/70 | HR 82 | Resp 16 | Ht 66.25 in | Wt 155.0 lb

## 2020-11-26 DIAGNOSIS — Z780 Asymptomatic menopausal state: Secondary | ICD-10-CM

## 2020-11-26 DIAGNOSIS — Z01419 Encounter for gynecological examination (general) (routine) without abnormal findings: Secondary | ICD-10-CM

## 2020-11-26 DIAGNOSIS — M8589 Other specified disorders of bone density and structure, multiple sites: Secondary | ICD-10-CM

## 2020-11-26 NOTE — Progress Notes (Signed)
Donna [name] 02-Apr-1945 [id number]   History:    75 y.o. G3P3L3 Married.  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  No significant hot flashes or night sweats.  No vaginal bleeding.  Pap smear Neg 2019.  No significant history of abnormal Pap smears.  Breasts normal. Mammogram Neg 10/2020.  Colonoscopy 2017.  DEXA 03/2018 Osteopenia, scheduled BD this month.  Follows with Dr. Osborne Casco for bone health.  BMI 24.83.  Good fitness and nutrition.  Health labs with Fam MD.  Past medical history,surgical history, family history and social history were all reviewed and documented in the EPIC chart.  Gynecologic History No LMP recorded. Patient is postmenopausal.  Obstetric History OB History  Gravida Para Term Preterm AB Living  3 3 3     3   SAB IAB Ectopic Multiple Live Births               # Outcome Date GA Lbr Len/2nd Weight Sex Delivery Anes PTL Lv  3 Term           2 Term           1 Term              ROS: A ROS was performed and pertinent positives and negatives are included in the history.  GENERAL: No fevers or chills. HEENT: No change in vision, no earache, sore throat or sinus congestion. NECK: No pain or stiffness. CARDIOVASCULAR: No chest pain or pressure. No palpitations. PULMONARY: No shortness of breath, cough or wheeze. GASTROINTESTINAL: No abdominal pain, nausea, vomiting or diarrhea, melena or bright red blood per rectum. GENITOURINARY: No urinary frequency, urgency, hesitancy or dysuria. MUSCULOSKELETAL: No joint or muscle pain, no back pain, no recent trauma. DERMATOLOGIC: No rash, no itching, no lesions. ENDOCRINE: No polyuria, polydipsia, no heat or cold intolerance. No recent change in weight. HEMATOLOGICAL: No anemia or easy bruising or bleeding. NEUROLOGIC: No headache, seizures, numbness, tingling or weakness. PSYCHIATRIC: No depression, no loss of interest in normal activity or  change in sleep pattern.     Exam:   BP 114/70   Pulse 82   Resp 16   Ht 5' 6.25" (1.683 m)   Wt 155 lb (70.3 kg)   BMI 24.83 kg/m   Body mass index is 24.83 kg/m.  General appearance : Well developed well nourished female. No acute distress HEENT: Eyes: no retinal hemorrhage or exudates,  Neck supple, trachea midline, no carotid bruits, no thyroidmegaly Lungs: Clear to auscultation, no rhonchi or wheezes, or rib retractions  Heart: Regular rate and rhythm, no murmurs or gallops Breast:Examined in sitting and supine position were symmetrical in appearance, no palpable masses or tenderness,  no skin retraction, no nipple inversion, no nipple discharge, no skin discoloration, no axillary or supraclavicular lymphadenopathy Abdomen: no palpable masses or tenderness, no rebound or guarding Extremities: no edema or skin discoloration or tenderness  Pelvic: Vulva: Normal             Vagina: No gross lesions or discharge  Cervix: No gross lesions or discharge  Uterus  AV, normal size, shape and consistency, non-tender and mobile  Adnexa  Without masses or tenderness  Anus: Normal   Assessment/Plan:  75 y.o. female for annual exam   1. Well female exam with routine gynecological exam Normal gynecologic exam in menopause.  No indication for Pap test this year.  Breast  exam normal.  Screening mammogram in August 2022 was negative.  Colonoscopy in 2017.  Good body mass index at 24.83.  Continue with fitness and healthy nutrition.  Health labs with family physician.  2. Postmenopause Well on no hormone replacement therapy.  No postmenopausal bleeding.  3. Osteopenia of multiple sites  Osteopenia on last bone density.  Bone density schedule this month.  Vitamin D supplements, calcium intake of 1.5 g/day total.  Continue with weightbearing physical activities.  Followed by Dr. Osborne Casco.  Princess Bruins MD, 9:22 AM 11/26/2020

## 2020-12-24 DIAGNOSIS — M25562 Pain in left knee: Secondary | ICD-10-CM | POA: Diagnosis not present

## 2020-12-24 DIAGNOSIS — M1712 Unilateral primary osteoarthritis, left knee: Secondary | ICD-10-CM | POA: Diagnosis not present

## 2021-01-09 ENCOUNTER — Other Ambulatory Visit: Payer: Self-pay | Admitting: Gastroenterology

## 2021-02-01 DIAGNOSIS — H524 Presbyopia: Secondary | ICD-10-CM | POA: Diagnosis not present

## 2021-02-15 ENCOUNTER — Telehealth: Payer: Self-pay | Admitting: Gastroenterology

## 2021-02-15 MED ORDER — PANTOPRAZOLE SODIUM 40 MG PO TBEC: DELAYED_RELEASE_TABLET | ORAL | 1 refills | Status: DC

## 2021-02-15 NOTE — Telephone Encounter (Signed)
Called pt and tole her she needs an office appointment She said she would call back and make it

## 2021-02-15 NOTE — Telephone Encounter (Signed)
Patient called requesting a refill of her pantoprazole to be called into the [facility] on [address].  If there is an issue, please call patient.  Thank you.

## 2021-03-10 DIAGNOSIS — L7211 Pilar cyst: Secondary | ICD-10-CM | POA: Diagnosis not present

## 2021-03-10 DIAGNOSIS — L821 Other seborrheic keratosis: Secondary | ICD-10-CM | POA: Diagnosis not present

## 2021-03-10 DIAGNOSIS — Z85828 Personal history of other malignant neoplasm of skin: Secondary | ICD-10-CM | POA: Diagnosis not present

## 2021-03-10 DIAGNOSIS — D2261 Melanocytic nevi of right upper limb, including shoulder: Secondary | ICD-10-CM | POA: Diagnosis not present

## 2021-03-31 DIAGNOSIS — E559 Vitamin D deficiency, unspecified: Secondary | ICD-10-CM | POA: Diagnosis not present

## 2021-03-31 DIAGNOSIS — E7800 Pure hypercholesterolemia, unspecified: Secondary | ICD-10-CM | POA: Diagnosis not present

## 2021-04-09 DIAGNOSIS — R82998 Other abnormal findings in urine: Secondary | ICD-10-CM | POA: Diagnosis not present

## 2021-04-09 DIAGNOSIS — Z1212 Encounter for screening for malignant neoplasm of rectum: Secondary | ICD-10-CM | POA: Diagnosis not present

## 2021-04-12 ENCOUNTER — Other Ambulatory Visit: Payer: Self-pay | Admitting: Internal Medicine

## 2021-04-12 DIAGNOSIS — E7800 Pure hypercholesterolemia, unspecified: Secondary | ICD-10-CM

## 2021-04-25 ENCOUNTER — Other Ambulatory Visit: Payer: Self-pay | Admitting: Gastroenterology

## 2021-04-29 ENCOUNTER — Ambulatory Visit: Payer: Medicare Other | Admitting: Gastroenterology

## 2021-05-04 ENCOUNTER — Ambulatory Visit: Payer: Medicare Other | Admitting: Gastroenterology

## 2021-05-05 ENCOUNTER — Ambulatory Visit: Payer: Medicare Other | Admitting: Gastroenterology

## 2021-05-05 ENCOUNTER — Encounter: Payer: Self-pay | Admitting: Gastroenterology

## 2021-05-05 VITALS — BP 122/84 | HR 65 | Ht 67.0 in | Wt 160.1 lb

## 2021-05-05 DIAGNOSIS — R053 Chronic cough: Secondary | ICD-10-CM

## 2021-05-05 DIAGNOSIS — K219 Gastro-esophageal reflux disease without esophagitis: Secondary | ICD-10-CM

## 2021-05-05 DIAGNOSIS — K449 Diaphragmatic hernia without obstruction or gangrene: Secondary | ICD-10-CM | POA: Diagnosis not present

## 2021-05-05 MED ORDER — FAMOTIDINE 20 MG PO TABS: 20.0000 mg | ORAL_TABLET | Freq: Every day | ORAL | 6 refills | Status: DC

## 2021-05-05 MED ORDER — LANSOPRAZOLE 30 MG PO CPDR: DELAYED_RELEASE_CAPSULE | ORAL | 6 refills | Status: DC

## 2021-05-05 NOTE — Progress Notes (Signed)
? ?       ? ?Donna [name]    [id number]    03/05/1946 ? ?Primary Care Physician:Tisovec, Fransico Him, MD ? ?Referring Physician: Haywood Pao, MD ?[address] ?[place],  [place] [address] ? ? ?Chief complaint:  GERD ? ?HPI: ? ?Donna [name] is a 76 year old very pleasant female with history of bladder cancer s/p resection here for follow-up visit for GERD. ?She is currently taking pantoprazole daily, continues to have intermittent cough especially worse at nighttime when she lays down or when she exercises ?She has occasional breakthrough heartburn, uses Tums as needed ? ?Denies any dysphagia, odynophagia, nausea, vomit, abdominal pain, melena or blood per rectum. ?  ?December 29, 2015 colonoscopy: Diverticulosis and internal hemorrhoids, no polyps. ?  ?EGD August 2012: Hiatal hernia, esophageal stricture dilated ? ?EGD July 2002: Cardia nodule biopsied, showed benign mucosa.  H. pylori test negative ? ? ? ?Outpatient Encounter Medications as of 05/05/2021  ?Medication Sig  ? cetirizine (ZYRTEC) 10 MG tablet Take 10 mg by mouth every morning.   ? cholecalciferol (VITAMIN D) 1000 units tablet Take 1,000 Units by mouth daily.  ? fluticasone (FLONASE) 50 MCG/ACT nasal spray Place into both nostrils daily.  ? guaifenesin (HUMIBID E) 400 MG TABS tablet Take 400 mg by mouth daily.  ? pantoprazole (PROTONIX) 40 MG tablet TAKE 1 TABLET(40 MG) BY MOUTH DAILY  ? ?Facility-Administered Encounter Medications as of 05/05/2021  ?Medication  ? mitomycin (MUTAMYCIN) chemo injection 40 mg  ? ? ?Allergies as of 05/05/2021  ? (No Known Allergies)  ? ? ?Past Medical History:  ?Diagnosis Date  ? Bladder cancer [facility]) urologist-- dr Alinda Money  ? first dx in 2000-- papillary non-invasive , s/p multiple TURBT's w/ Mitomyocin C  ? Hiatal hernia   ? History of colon polyps   ? Osteopenia 2020  ? T score -1.3 FRAX 9% / 1%  ? Wears contact lenses   ? ? ?Past Surgical History:  ?Procedure Laterality Date  ? COLONOSCOPY    ? CT CARDIAC  SCORING  03/28/2017  ? SCORE= 0  ? CYSTOSCOPY W/ RETROGRADES  07/04/2011  ? Procedure: CYSTOSCOPY WITH RETROGRADE PYELOGRAM;  Surgeon: Dutch Gray, MD;  Location: [facility];  Service: Urology;  Laterality: Bilateral;  ? CYSTOSCOPY W/ URETERAL STENT PLACEMENT Bilateral 05/27/2013  ? Procedure: CYSTOSCOPY WITH RETROGRADE PYELOGRAM/URETERAL STENT PLACEMENT LEFT;  Surgeon: Dutch Gray, MD;  [facility];  Service: Urology;  Laterality: Bilateral;  ? TRANSURETHRAL RESECTION OF BLADDER TUMOR  07/04/2011  ? Procedure: TRANSURETHRAL RESECTION OF BLADDER TUMOR (TURBT);  Surgeon: Dutch Gray, MD;  [facility];  Service: Urology;  Laterality: N/A;  ? TRANSURETHRAL RESECTION OF BLADDER TUMOR N/A 05/27/2013  ? Procedure: TRANSURETHRAL RESECTION OF BLADDER TUMOR (TURBT);  Surgeon: Dutch Gray, MD;  [facility];  Service: Urology;  Laterality: N/A;  ? TRANSURETHRAL RESECTION OF BLADDER TUMOR Bilateral 06/05/2017  ? Procedure: TRANSURETHRAL RESECTION OF BLADDER TUMOR (TURBT)/ CYSTSCOPY/ BILATERAL RETROGADE PYLEOGRAM;  Surgeon: Raynelle Bring, MD;  [facility];  Service: Urology;  Laterality: Bilateral;  GENERAL ANESTHESIA WITH PARALYSIS  ? TRANSURETHRAL RESECTION OF BLADDER TUMOR WITH MITOMYCIN-C  x2 in 2000;  x2 in 2001;  2002;  2003;  2005;  2006  ? dr humphries  ? UPPER GASTROINTESTINAL ENDOSCOPY    ? ? ?Family History  ?Problem Relation Age of Onset  ? Stomach cancer Father   ? Heart attack Mother   ? Cancer Brother   ?     Prostate  ?  Colon cancer Neg Hx   ? Esophageal cancer Neg Hx   ? Colon polyps Neg Hx   ? Rectal cancer Neg Hx   ? ? ?Social History  ? ?Socioeconomic History  ? Marital status: Married  ?  Spouse name: Not on file  ? Number of children: Not on file  ? Years of education: Not on file  ? Highest education level: Not on file  ?Occupational History  ? Not on file  ?Tobacco Use  ? Smoking status: Former  ?  Packs/day: 0.50  ?  Years: 10.00  ?  Pack years: 5.00  ?  Types: Cigarettes  ?  Quit date: 06/27/1981   ?  Years since quitting: 39.8  ? Smokeless tobacco: Never  ?Vaping Use  ? Vaping Use: Never used  ?Substance and Sexual Activity  ? Alcohol use: Yes  ?  Comment: occasional  ? Drug use: No  ? Sexual activity: Yes  ?  Birth control/protection: Post-menopausal, Other-see comments  ?  Comment: 1st intercourse 76 yo-Fewer than 5 partners, husband vasectomy  ?Other Topics Concern  ? Not on file  ?Social History Narrative  ? Not on file  ? ?Social Determinants of Health  ? ?Financial Resource Strain: Not on file  ?Food Insecurity: Not on file  ?Transportation Needs: Not on file  ?Physical Activity: Not on file  ?Stress: Not on file  ?Social Connections: Not on file  ?Intimate Partner Violence: Not on file  ? ? ? ? ?Review of systems: ?All other review of systems negative except as mentioned in the HPI. ? ? ?Physical Exam: ?Vitals:  ? 05/05/21 1049  ?BP: 122/84  ?Pulse: 65  ?SpO2: 96%  ? ?Body mass index is 25.08 kg/m?. ?Gen:      No acute distress ?HEENT:  sclera anicteric ?Neuro: alert and oriented x 3 ?Psych: normal mood and affect ? ?Data Reviewed: ? ?Reviewed labs, radiology imaging, old records and pertinent past GI work up ? ? ?Assessment and Plan/Recommendations: ? ?76 year old very pleasant female with history of bladder cancer s/p resection, hiatal hernia, GERD with chronic dry cough ?  ?Switch to lansoprazole 30 mg daily, 30 minutes before breakfast ?Stop pantoprazole ?Use famotidine 20 mg at bedtime as needed ?Discussed antireflux measures and lifestyle modifications ? ?If continues to have persistent breakthrough GERD symptoms and cough suggestive of ongoing acid reflux, will consider EGD with 48-hour pH Bravo study for further evaluation and may consider hiatal hernia repair ? ?Return in 4 months ? ?This visit required 30 minutes of patient care (this includes precharting, chart review, review of results, face-to-face time used for counseling as well as treatment plan and follow-up. The patient was provided  an opportunity to ask questions and all were answered. The patient agreed with the plan and demonstrated an understanding of the instructions. ? ?K. Denzil Magnuson , MD ?  ? ?CC: Tisovec, Fransico Him, MD ? ? ?

## 2021-05-05 NOTE — Patient Instructions (Signed)
STOP Pantoprazole  We have sent the following medications to your pharmacy for you to pick up at your convenience:   Lansoprazole Famotidine   Gastroesophageal Reflux Disease, Adult Gastroesophageal reflux (GER) happens when acid from the stomach flows up into the tube that connects the mouth and the stomach (esophagus). Normally, food travels down the esophagus and stays in the stomach to be digested. However, when a person has GER, food and stomach acid sometimes move back up into the esophagus. If this becomes a more serious problem, the person may be diagnosed with a disease called gastroesophageal reflux disease (GERD). GERD occurs when the reflux: Happens often. Causes frequent or severe symptoms. Causes problems such as damage to the esophagus. When stomach acid comes in contact with the esophagus, the acid may cause inflammation in the esophagus. Over time, GERD may create small holes (ulcers) in the lining of the esophagus. What are the causes? This condition is caused by a problem with the muscle between the esophagus and the stomach (lower esophageal sphincter, or LES). Normally, the LES muscle closes after food passes through the esophagus to the stomach. When the LES is weakened or abnormal, it does not close properly, and that allows food and stomach acid to go back up into the esophagus. The LES can be weakened by certain dietary substances, medicines, and medical conditions, including: Tobacco use. Pregnancy. Having a hiatal hernia. Alcohol use. Certain foods and beverages, such as coffee, chocolate, onions, and peppermint. What increases the risk? You are more likely to develop this condition if you: Have an increased body weight. Have a connective tissue disorder. Take NSAIDs, such as ibuprofen. What are the signs or symptoms? Symptoms of this condition include: Heartburn. Difficult or painful swallowing and the feeling of having a lump in the throat. A bitter taste in  the mouth. Bad breath and having a large amount of saliva. Having an upset or bloated stomach and belching. Chest pain. Different conditions can cause chest pain. Make sure you see your health care provider if you experience chest pain. Shortness of breath or wheezing. Ongoing (chronic) cough or a nighttime cough. Wearing away of tooth enamel. Weight loss. How is this diagnosed? This condition may be diagnosed based on a medical history and a physical exam. To determine if you have mild or severe GERD, your health care provider may also monitor how you respond to treatment. You may also have tests, including: A test to examine your stomach and esophagus with a small camera (endoscopy). A test that measures the acidity level in your esophagus. A test that measures how much pressure is on your esophagus. A barium swallow or modified barium swallow test to show the shape, size, and functioning of your esophagus. How is this treated? Treatment for this condition may vary depending on how severe your symptoms are. Your health care provider may recommend: Changes to your diet. Medicine. Surgery. The goal of treatment is to help relieve your symptoms and to prevent complications. Follow these instructions at home: Eating and drinking  Follow a diet as recommended by your health care provider. This may involve avoiding foods and drinks such as: Coffee and tea, with or without caffeine. Drinks that contain alcohol. Energy drinks and sports drinks. Carbonated drinks or sodas. Chocolate and cocoa. Peppermint and mint flavorings. Garlic and onions. Horseradish. Spicy and acidic foods, including peppers, chili powder, curry powder, vinegar, hot sauces, and barbecue sauce. Citrus fruit juices and citrus fruits, such as oranges, lemons, and limes. Tomato-based  foods, such as red sauce, chili, salsa, and pizza with red sauce. Fried and fatty foods, such as donuts, french fries, potato chips, and  high-fat dressings. High-fat meats, such as hot dogs and fatty cuts of red and white meats, such as rib eye steak, sausage, ham, and bacon. High-fat dairy items, such as whole milk, butter, and cream cheese. Eat small, frequent meals instead of large meals. Avoid drinking large amounts of liquid with your meals. Avoid eating meals during the 2-3 hours before bedtime. Avoid lying down right after you eat. Do not exercise right after you eat. Lifestyle  Do not use any products that contain nicotine or tobacco. These products include cigarettes, chewing tobacco, and vaping devices, such as e-cigarettes. If you need help quitting, ask your health care provider. Try to reduce your stress by using methods such as yoga or meditation. If you need help reducing stress, ask your health care provider. If you are overweight, reduce your weight to an amount that is healthy for you. Ask your health care provider for guidance about a safe weight loss goal. General instructions Pay attention to any changes in your symptoms. Take over-the-counter and prescription medicines only as told by your health care provider. Do not take aspirin, ibuprofen, or other NSAIDs unless your health care provider told you to take these medicines. Wear loose-fitting clothing. Do not wear anything tight around your waist that causes pressure on your abdomen. Raise (elevate) the head of your bed about 6 inches (15 cm). You can use a wedge to do this. Avoid bending over if this makes your symptoms worse. Keep all follow-up visits. This is important. Contact a health care provider if: You have: New symptoms. Unexplained weight loss. Difficulty swallowing or it hurts to swallow. Wheezing or a persistent cough. A hoarse voice. Your symptoms do not improve with treatment. Get help right away if: You have sudden pain in your arms, neck, jaw, teeth, or back. You suddenly feel sweaty, dizzy, or light-headed. You have chest pain or  shortness of breath. You vomit and the vomit is green, yellow, or black, or it looks like blood or coffee grounds. You faint. You have stool that is red, bloody, or black. You cannot swallow, drink, or eat. These symptoms may represent a serious problem that is an emergency. Do not wait to see if the symptoms will go away. Get medical help right away. Call your local emergency services (911 in the [place] Do not drive yourself to the hospital. Summary Gastroesophageal reflux happens when acid from the stomach flows up into the esophagus. GERD is a disease in which the reflux happens often, causes frequent or severe symptoms, or causes problems such as damage to the esophagus. Treatment for this condition may vary depending on how severe your symptoms are. Your health care provider may recommend diet and lifestyle changes, medicine, or surgery. Contact a health care provider if you have new or worsening symptoms. Take over-the-counter and prescription medicines only as told by your health care provider. Do not take aspirin, ibuprofen, or other NSAIDs unless your health care provider told you to do so. Keep all follow-up visits as told by your health care provider. This is important. This information is not intended to replace advice given to you by your health care provider. Make sure you discuss any questions you have with your health care provider. Document Revised: 09/02/2019 Document Reviewed: 09/02/2019 Elsevier Patient Education  [place].   If you are age 23 or older,  your body mass index should be between 23-30. Your Body mass index is 25.08 kg/m. If this is out of the aforementioned range listed, please consider follow up with your Primary Care Provider.  If you are age 49 or younger, your body mass index should be between 19-25. Your Body mass index is 25.08 kg/m. If this is out of the aformentioned range listed, please consider follow up with your Primary Care Provider.    ________________________________________________________  The [facility] providers would like to encourage you to use [facility] to communicate with providers for non-urgent requests or questions.  Due to long hold times on the telephone, sending your provider a message by [facility] may be a faster and more efficient way to get a response.  Please allow 48 business hours for a response.  Please remember that this is for non-urgent requests.  _______________________________________________________   I appreciate the  opportunity to care for you  Thank You   Harl Bowie , MD

## 2021-05-13 ENCOUNTER — Ambulatory Visit
Admission: RE | Admit: 2021-05-13 | Discharge: 2021-05-13 | Disposition: A | Discharge status: Home / Self Care | Payer: No Typology Code available for payment source | Source: Ambulatory Visit | Attending: Internal Medicine | Admitting: Internal Medicine

## 2021-05-13 DIAGNOSIS — E785 Hyperlipidemia, unspecified: Secondary | ICD-10-CM | POA: Diagnosis not present

## 2021-05-13 DIAGNOSIS — E7800 Pure hypercholesterolemia, unspecified: Secondary | ICD-10-CM

## 2021-05-13 DIAGNOSIS — Z8249 Family history of ischemic heart disease and other diseases of the circulatory system: Secondary | ICD-10-CM | POA: Diagnosis not present

## 2021-05-26 ENCOUNTER — Telehealth: Payer: Self-pay | Admitting: Gastroenterology

## 2021-05-26 NOTE — Telephone Encounter (Signed)
Patient called stating that since changing from pantoprazole to lansoprazole she's noticed she is having more loose stools and wanted to know if this was normal.  Please call patient and advise.  Thank you. ?

## 2021-05-26 NOTE — Telephone Encounter (Signed)
Spoke with the patient.  ?She started Lansoprazole about 2 weeks ago for chronic cough and reflux. She has noticed this is better. She calls with complaints of "off and on loose stools." She reports this is not every day and it is never in excess of 4 stools a day. She does not have abdominal pain or fevers. Diet change is she is eating "lots of cashews and other nuts."  She wonders if this could be the cause as well.  ? ?She would like to eliminate the nuts and see if her symptoms cease. If that does not help, she would like to stop the Lansoprazole and go back to Pantoprazole. Agrees to do one thing at a time to gauge her response. She agrees to call back should she acutely worsen or fail to improve.  ? ?Is this okay? ?

## 2021-05-27 NOTE — Telephone Encounter (Signed)
Sounds good. Thank you

## 2021-06-30 DIAGNOSIS — Z8551 Personal history of malignant neoplasm of bladder: Secondary | ICD-10-CM | POA: Diagnosis not present

## 2021-07-04 ENCOUNTER — Other Ambulatory Visit: Payer: Self-pay | Admitting: Gastroenterology

## 2021-07-13 ENCOUNTER — Telehealth: Payer: Self-pay | Admitting: Gastroenterology

## 2021-07-13 NOTE — Telephone Encounter (Signed)
Yes please bring her in soon for office visit soon when she gets back. Thanks ?

## 2021-07-13 NOTE — Telephone Encounter (Signed)
Patient leaves for [place] tomorrow. She reports loose stools off and on through the day. Usually this occurs in the morning. She will sometimes have another loose stool in the day. Not in excess of 4 stools per day. In our previous conversation, she was having this same issue. That was March 22. At that time she had felt the dietary indiscretions could have caused her loose stools. She was going to improve her diet and see if the loose stools stopped. If that did not improve her symptoms, she would change from the new Lansoprazole to her previous dosage of Pantoprazole.  ?She denies any fever, chills, loss of appetite or nausea. No abdominal pain. She will take Pantoprazole in place of Lansoprazole starting tomorrow. She will take Imodium or Pepto-Bismol with her in case she worsens. Agrees to follow up with [place] when she returns. ?Is this okay? ?

## 2021-07-13 NOTE — Telephone Encounter (Signed)
We received a call form patient, stating that she has had diarrhea for the past week and will be going out of town soon and wants to discuss what she can do. Please advise. ?

## 2021-09-13 DIAGNOSIS — K219 Gastro-esophageal reflux disease without esophagitis: Secondary | ICD-10-CM | POA: Diagnosis not present

## 2021-09-13 DIAGNOSIS — J4520 Mild intermittent asthma, uncomplicated: Secondary | ICD-10-CM | POA: Diagnosis not present

## 2021-09-13 DIAGNOSIS — R051 Acute cough: Secondary | ICD-10-CM | POA: Diagnosis not present

## 2021-09-24 ENCOUNTER — Other Ambulatory Visit: Payer: Self-pay | Admitting: Internal Medicine

## 2021-09-24 DIAGNOSIS — Z1231 Encounter for screening mammogram for malignant neoplasm of breast: Secondary | ICD-10-CM

## 2021-09-27 ENCOUNTER — Ambulatory Visit: Payer: Medicare Other | Admitting: Gastroenterology

## 2021-09-27 ENCOUNTER — Encounter: Payer: Self-pay | Admitting: Gastroenterology

## 2021-09-27 VITALS — BP 118/76 | HR 64 | Ht 67.0 in | Wt 157.5 lb

## 2021-09-27 DIAGNOSIS — K580 Irritable bowel syndrome with diarrhea: Secondary | ICD-10-CM

## 2021-09-27 DIAGNOSIS — K219 Gastro-esophageal reflux disease without esophagitis: Secondary | ICD-10-CM

## 2021-09-27 DIAGNOSIS — K649 Unspecified hemorrhoids: Secondary | ICD-10-CM | POA: Diagnosis not present

## 2021-09-27 MED ORDER — PRAMOXINE-HC 1-1 % EX CREA: TOPICAL_CREAM | Freq: Two times a day (BID) | CUTANEOUS | 0 refills | Status: DC | PRN

## 2021-09-27 MED ORDER — PANTOPRAZOLE SODIUM 40 MG PO TBEC: 40.0000 mg | DELAYED_RELEASE_TABLET | Freq: Every day | ORAL | 3 refills | Status: DC

## 2021-09-27 NOTE — Progress Notes (Signed)
Donna [name]    [id number]    Sep 18, 1945  Primary Care Physician:Tisovec, Fransico Him, MD  Referring Physician: Haywood Pao, MD [address] [place],  [place] [address]   Chief complaint:  GERD, Hemorrhoids  HPI:  76 yr old very pleasant female with history of bladder cancer s/p resection here for follow-up visit for GERD. She is currently taking pantoprazole daily, continues to have intermittent cough especially worse at nighttime when she lays down or when she exercises She has occasional breakthrough heartburn, uses Tums as needed   Denies any dysphagia, odynophagia, nausea, vomit, abdominal pain, melena or blood per rectum.  She is having intermittent symptoms from hemorrhoids.  Review of system positive for sinus and nasal congestion.  She is also experiencing blocked eustachian tube   December 29, 2015 colonoscopy: Diverticulosis and internal hemorrhoids, no polyps.   EGD August 2012: Hiatal hernia, esophageal stricture dilated   EGD July 2002: Cardia nodule biopsied, showed benign mucosa.  H. pylori test negative   Outpatient Encounter Medications as of 09/27/2021  Medication Sig   cetirizine (ZYRTEC) 10 MG tablet Take 10 mg by mouth every morning.    cholecalciferol (VITAMIN D) 1000 units tablet Take 1,000 Units by mouth daily.   guaifenesin (HUMIBID E) 400 MG TABS tablet Take 400 mg by mouth daily.   pantoprazole (PROTONIX) 40 MG tablet TAKE 1 TABLET(40 MG) BY MOUTH DAILY   [DISCONTINUED] famotidine (PEPCID) 20 MG tablet Take 1 tablet (20 mg total) by mouth at bedtime. As needed   [DISCONTINUED] lansoprazole (PREVACID) 30 MG capsule I capsule 30 minutes daily before breakfast daily (Patient not taking: Reported on 09/27/2021)   Facility-Administered Encounter Medications as of 09/27/2021  Medication   mitomycin (MUTAMYCIN) chemo injection 40 mg    Allergies as of 09/27/2021   (No Known Allergies)    Past Medical History:  Diagnosis Date    Bladder cancer [facility]) urologist-- dr Alinda Money   first dx in 2000-- papillary non-invasive , s/p multiple TURBT's w/ Mitomyocin C   Hiatal hernia    History of colon polyps    Osteopenia 2020   T score -1.3 FRAX 9% / 1%   Wears contact lenses     Past Surgical History:  Procedure Laterality Date   COLONOSCOPY     CT CARDIAC SCORING  03/28/2017   SCORE= 0   CYSTOSCOPY W/ RETROGRADES  07/04/2011   Procedure: CYSTOSCOPY WITH RETROGRADE PYELOGRAM;  Surgeon: Dutch Gray, MD;  [facility];  Service: Urology;  Laterality: Bilateral;   CYSTOSCOPY W/ URETERAL STENT PLACEMENT Bilateral 05/27/2013   Procedure: CYSTOSCOPY WITH RETROGRADE PYELOGRAM/URETERAL STENT PLACEMENT LEFT;  Surgeon: Dutch Gray, MD;  [facility];  Service: Urology;  Laterality: Bilateral;   TRANSURETHRAL RESECTION OF BLADDER TUMOR  07/04/2011   Procedure: TRANSURETHRAL RESECTION OF BLADDER TUMOR (TURBT);  Surgeon: Dutch Gray, MD;  [facility]: [facility];  Service: Urology;  Laterality: N/A;   TRANSURETHRAL RESECTION OF BLADDER TUMOR N/A 05/27/2013   Procedure: TRANSURETHRAL RESECTION OF BLADDER TUMOR (TURBT);  Surgeon: Dutch Gray, MD;  [facility];  Service: Urology;  Laterality: N/A;   TRANSURETHRAL RESECTION OF BLADDER TUMOR Bilateral 06/05/2017   Procedure: TRANSURETHRAL RESECTION OF BLADDER TUMOR (TURBT)/ CYSTSCOPY/ BILATERAL RETROGADE [id number];  Surgeon: Raynelle Bring, MD;  Location: [facility];  Service: Urology;  Laterality: Bilateral;  GENERAL ANESTHESIA WITH PARALYSIS   TRANSURETHRAL RESECTION OF BLADDER TUMOR WITH MITOMYCIN-C  x2 in 2000;  x2 in 2001;  2002;  2003;  2005;  2006   dr humphries   UPPER GASTROINTESTINAL ENDOSCOPY      Family History  Problem Relation Age of Onset   Stomach cancer Father    Heart attack Mother    Cancer Brother        Prostate   Colon cancer Neg Hx    Esophageal cancer Neg Hx    Colon polyps Neg Hx    Rectal cancer Neg Hx     Social History   Socioeconomic History   Marital  status: Married    Spouse name: Not on file   Number of children: Not on file   Years of education: Not on file   Highest education level: Not on file  Occupational History   Not on file  Tobacco Use   Smoking status: Former    Packs/day: 0.50    Years: 10.00    Total pack years: 5.00    Types: Cigarettes    Quit date: 06/27/1981    Years since quitting: 40.2   Smokeless tobacco: Never  Vaping Use   Vaping Use: Never used  Substance and Sexual Activity   Alcohol use: Yes    Comment: occasional   Drug use: No   Sexual activity: Yes    Birth control/protection: Post-menopausal, Other-see comments    Comment: 1st intercourse 76 yo-Fewer than 5 partners, husband vasectomy  Other Topics Concern   Not on file  Social History Narrative   Not on file   Social Determinants of Health   Financial Resource Strain: Not on file  Food Insecurity: Not on file  Transportation Needs: Not on file  Physical Activity: Not on file  Stress: Not on file  Social Connections: Not on file  Intimate Partner Violence: Not on file      Review of systems: All other review of systems negative except as mentioned in the HPI.   Physical Exam: Vitals:   09/27/21 1023  BP: 118/76  Pulse: 64  SpO2: 98%   Body mass index is 24.67 kg/m. Gen:      No acute distress HEENT:  sclera anicteric Abd:      soft, non-tender; no palpable masses, no distension Ext:    No edema Neuro: alert and oriented x 3 Psych: normal mood and affect  Data Reviewed:  Reviewed labs, radiology imaging, old records and pertinent past GI work up   Assessment and Plan/Recommendations:  76 year old very pleasant female with history of bladder cancer s/p resection, hiatal hernia, GERD with chronic dry cough   Continue pantoprazole Use famotidine 20 mg at bedtime as needed Discussed antireflux measures and lifestyle modifications   If continues to have persistent breakthrough GERD symptoms and cough suggestive of  ongoing acid reflux, will consider EGD with 48-hour pH Bravo study for further evaluation and may consider hiatal hernia repair  IBS diarrhea: Trial of lactose-free diet  Symptomatic hemorrhoids: Use Analpram small pea-sized amount per rectum twice daily as needed Use Benefiber 1 tablespoon twice daily Excessive straining during defecation  Return in 3 months   Return in 4 months  The patient was provided an opportunity to ask questions and all were answered. The patient agreed with the plan and demonstrated an understanding of the instructions.  Damaris Hippo , MD    CC: Tisovec, Fransico Him, MD

## 2021-09-27 NOTE — Patient Instructions (Signed)
Please purchase the following medications over the counter and take as directed: Benefiber 1 tablespoon twice daily  We have sent the following medications to your pharmacy for you to pick up at your convenience: Analpram-apply to rectum twice daily as needed  Pantoprazole 40 mg daily.  Please follow a lactose free diet.  Follow up with Dr Silverio Decamp in 3-4 months.  If you are age 75 or older, your body mass index should be between 23-30. Your Body mass index is 24.67 kg/m. If this is out of the aforementioned range listed, please consider follow up with your Primary Care Provider.  If you are age 43 or younger, your body mass index should be between 19-25. Your Body mass index is 24.67 kg/m. If this is out of the aformentioned range listed, please consider follow up with your Primary Care Provider.   ________________________________________________________  The [facility] providers would like to encourage you to use [facility] to communicate with providers for non-urgent requests or questions.  Due to long hold times on the telephone, sending your provider a message by [facility] may be a faster and more efficient way to get a response.  Please allow 48 business hours for a response.  Please remember that this is for non-urgent requests.  _______________________________________________________,I Due to recent changes in healthcare laws, you may see the results of your imaging and laboratory studies on MyChart before your provider has had a chance to review them.  We understand that in some cases there may be results that are confusing or concerning to you. Not all laboratory results come back in the same time frame and the provider may be waiting for multiple results in order to interpret others.  Please give [place] 48 hours in order for your provider to thoroughly review all the results before contacting the office for clarification of your results.

## 2021-09-28 ENCOUNTER — Telehealth: Payer: Self-pay | Admitting: Pharmacy Technician

## 2021-09-28 ENCOUNTER — Other Ambulatory Visit (HOSPITAL_COMMUNITY): Payer: Self-pay

## 2021-09-28 NOTE — Telephone Encounter (Signed)
Patient Advocate Encounter  Received notification from PHARMACY that prior authorization for PRAMSONE 1% is required.   PA submitted on 7.25.23 Key [id number] Status is pending    Luciano Cutter, CPhT Patient Advocate Phone: [phone]

## 2021-09-30 DIAGNOSIS — H6992 Unspecified Eustachian tube disorder, left ear: Secondary | ICD-10-CM | POA: Diagnosis not present

## 2021-09-30 DIAGNOSIS — I499 Cardiac arrhythmia, unspecified: Secondary | ICD-10-CM | POA: Diagnosis not present

## 2021-09-30 DIAGNOSIS — J0100 Acute maxillary sinusitis, unspecified: Secondary | ICD-10-CM | POA: Diagnosis not present

## 2021-09-30 MED ORDER — HYDROCORTISONE (PERIANAL) 2.5 % EX CREA: 1.0000 | TOPICAL_CREAM | Freq: Two times a day (BID) | CUTANEOUS | 0 refills | Status: DC

## 2021-09-30 MED ORDER — HYDROCORTISONE (PERIANAL) 2.5 % EX CREA: TOPICAL_CREAM | CUTANEOUS | 0 refills | Status: DC

## 2021-09-30 NOTE — Telephone Encounter (Signed)
Please send Rx for proctosol HC twice daily small amount per rectum as needed X 7 days. Thanks

## 2021-09-30 NOTE — Addendum Note (Signed)
Addended by: Oda Kilts on: 09/30/2021 04:54 PM   Modules accepted: Orders

## 2021-09-30 NOTE — Telephone Encounter (Signed)
Patient Advocate Encounter  Received a fax regarding Prior Authorization from [facility] for Pramosone 1-1% cream.   Authorization has been DENIED due to criteria not met. Patient must try and fail at least 3 alternative formulary medications (procto-med, procto-pak, proctosol hc, proctozone-hc). Denial letter added to patient chart.  Clista Bernhardt, CPhT Pharmacy Patient Advocate Specialist Phone: [phone]

## 2021-09-30 NOTE — Telephone Encounter (Signed)
I will forward to Dr Silverio Decamp to decide the alternative  Thank You

## 2021-09-30 NOTE — Telephone Encounter (Signed)
Proctosol sent in for patient

## 2021-10-04 ENCOUNTER — Encounter: Payer: Self-pay | Admitting: Gastroenterology

## 2021-11-03 ENCOUNTER — Ambulatory Visit
Admission: RE | Admit: 2021-11-03 | Discharge: 2021-11-03 | Disposition: A | Discharge status: Home / Self Care | Payer: Medicare Other | Source: Ambulatory Visit | Attending: Internal Medicine | Admitting: Internal Medicine

## 2021-11-03 DIAGNOSIS — Z1231 Encounter for screening mammogram for malignant neoplasm of breast: Secondary | ICD-10-CM | POA: Diagnosis not present

## 2021-11-05 ENCOUNTER — Other Ambulatory Visit: Payer: Self-pay | Admitting: Internal Medicine

## 2021-11-05 DIAGNOSIS — R928 Other abnormal and inconclusive findings on diagnostic imaging of breast: Secondary | ICD-10-CM

## 2021-11-16 ENCOUNTER — Ambulatory Visit
Admission: RE | Admit: 2021-11-16 | Discharge: 2021-11-16 | Disposition: A | Discharge status: Home / Self Care | Payer: Medicare Other | Source: Ambulatory Visit | Attending: Internal Medicine | Admitting: Internal Medicine

## 2021-11-16 DIAGNOSIS — R928 Other abnormal and inconclusive findings on diagnostic imaging of breast: Secondary | ICD-10-CM

## 2021-12-22 DIAGNOSIS — M545 Low back pain, unspecified: Secondary | ICD-10-CM | POA: Diagnosis not present

## 2021-12-22 DIAGNOSIS — M25551 Pain in right hip: Secondary | ICD-10-CM | POA: Diagnosis not present

## 2021-12-28 ENCOUNTER — Encounter: Payer: Self-pay | Admitting: Cardiology

## 2021-12-28 ENCOUNTER — Other Ambulatory Visit: Payer: Self-pay

## 2021-12-28 ENCOUNTER — Ambulatory Visit: Payer: Self-pay | Admitting: Cardiology

## 2021-12-28 VITALS — BP 126/73 | HR 73 | Temp 97.2°F | Resp 16 | Ht 67.0 in | Wt 159.6 lb

## 2021-12-28 DIAGNOSIS — I491 Atrial premature depolarization: Secondary | ICD-10-CM

## 2021-12-28 DIAGNOSIS — R9431 Abnormal electrocardiogram [ECG] [EKG]: Secondary | ICD-10-CM

## 2021-12-28 NOTE — Progress Notes (Signed)
ID:  Declan Adamson, DOB [birth year], MRN [id number]  PCP:  Haywood Pao, MD  Cardiologist:  Rex Kras, DO, [facility] (established care 12/28/2021)  REASON FOR CONSULT: Cardiac arrhythmia   REQUESTING PHYSICIAN:  Tisovec, Fransico Him, MD [address] [place],  [place] [address]  Chief Complaint  Patient presents with   Cardiac arrhythmia   Abnormal ECG   New Patient (Initial Visit)    HPI  Donna [name] is a 76 y.o. Caucasian female who presents to the clinic for evaluation of cardiac arrhythmia/PACs at the request of Tisovec, Fransico Him, MD. Her past medical history and cardiovascular risk factors include: History of bladder cancer 2020 (cystoscopy/BCG treatment), hyperlipidemia, osteopenia, postmenopausal female.  In August 2023 patient was having an episode of URI and was having excessive amount of coughing spells.  She followed up with her PCP and on auscultation was noted to have extra beats.  This prompted an EKG which noted sinus rhythm with question of bigeminy.  At that time she was referred to cardiology.  However, the August 2023 referral just became fruitful in October 2023.  Clinically she denies anginal discomfort or heart failure symptoms.  She denies near-syncope or syncope.  FUNCTIONAL STATUS: Enjoys doing Pilates at least twice a week, goes out for a walk once or twice a week as well.  She continues to work as Forensic psychologist.  ALLERGIES: No Known Allergies  MEDICATION LIST PRIOR TO VISIT: Current Meds  Medication Sig   cetirizine (ZYRTEC) 10 MG tablet Take 10 mg by mouth every morning.    cholecalciferol (VITAMIN D) 1000 units tablet Take 1,000 Units by mouth daily.   guaiFENesin (MUCUS RELIEF ADULT PO) Take by mouth as needed.   pantoprazole (PROTONIX) 40 MG tablet Take 1 tablet (40 mg total) by mouth daily.     PAST MEDICAL HISTORY: Past Medical History:  Diagnosis Date   Bladder cancer [facility]) urologist-- dr Alinda Money   first dx in 2000--  papillary non-invasive , s/p multiple TURBT's w/ Mitomyocin C   Hiatal hernia    History of colon polyps    Osteopenia 2020   T score -1.3 FRAX 9% / 1%   Wears contact lenses     PAST SURGICAL HISTORY: Past Surgical History:  Procedure Laterality Date   COLONOSCOPY     CT CARDIAC SCORING  03/28/2017   SCORE= 0   CYSTOSCOPY W/ RETROGRADES  07/04/2011   Procedure: CYSTOSCOPY WITH RETROGRADE PYELOGRAM;  Surgeon: Dutch Gray, MD;  Location: [facility];  Service: Urology;  Laterality: Bilateral;   CYSTOSCOPY W/ URETERAL STENT PLACEMENT Bilateral 05/27/2013   Procedure: CYSTOSCOPY WITH RETROGRADE PYELOGRAM/URETERAL STENT PLACEMENT LEFT;  Surgeon: Dutch Gray, MD;  Location: [facility];  Service: Urology;  Laterality: Bilateral;   TRANSURETHRAL RESECTION OF BLADDER TUMOR  07/04/2011   Procedure: TRANSURETHRAL RESECTION OF BLADDER TUMOR (TURBT);  Surgeon: Dutch Gray, MD;  Location: [facility];  Service: Urology;  Laterality: N/A;   TRANSURETHRAL RESECTION OF BLADDER TUMOR N/A 05/27/2013   Procedure: TRANSURETHRAL RESECTION OF BLADDER TUMOR (TURBT);  Surgeon: Dutch Gray, MD;  Location: [facility];  Service: Urology;  Laterality: N/A;   TRANSURETHRAL RESECTION OF BLADDER TUMOR Bilateral 06/05/2017   Procedure: TRANSURETHRAL RESECTION OF BLADDER TUMOR (TURBT)/ CYSTSCOPY/ BILATERAL RETROGADE CVELFYBOF;  Surgeon: Raynelle Bring, MD;  Location: [facility];  Service: Urology;  Laterality: Bilateral;  GENERAL ANESTHESIA WITH PARALYSIS   TRANSURETHRAL RESECTION OF BLADDER TUMOR WITH MITOMYCIN-C  x2 in 2000;  x2 in 2001;  2002;  2003;  2005;  2006   dr humphries   UPPER GASTROINTESTINAL ENDOSCOPY      FAMILY HISTORY: The patient family history includes Cancer in her brother; Cholelithiasis in her sister; Heart attack in her mother; Stomach cancer in her father.  SOCIAL HISTORY:  The patient  reports that she quit smoking about 40 years ago. Her smoking use included cigarettes. She has a 5.00 pack-year smoking history. She has  never used smokeless tobacco. She reports current alcohol use. She reports that she does not use drugs.  REVIEW OF SYSTEMS: Review of Systems  Cardiovascular:  Negative for chest pain, claudication, dyspnea on exertion, irregular heartbeat, leg swelling, near-syncope, orthopnea, palpitations, paroxysmal nocturnal dyspnea and syncope.  Respiratory:  Negative for shortness of breath.   Hematologic/Lymphatic: Negative for bleeding problem.  Musculoskeletal:  Negative for muscle cramps and myalgias.  Neurological:  Negative for dizziness and light-headedness.    PHYSICAL EXAM:    12/28/2021   10:47 AM 09/27/2021   10:23 AM 05/05/2021   10:49 AM  Vitals with BMI  Height '5\' 7"'$  '5\' 7"'$  '5\' 7"'$   Weight 159 lbs 10 oz 157 lbs 8 oz 160 lbs 2 oz  BMI 24.99 17.51 02.58  Systolic 527 782 423  Diastolic 73 76 84  Pulse 73 64 65    Physical Exam  Constitutional: No distress.  Age appropriate, hemodynamically stable.   Neck: No JVD present.  Cardiovascular: Normal rate, regular rhythm, S1 normal, S2 normal, intact distal pulses and normal pulses. Exam reveals no gallop, no S3 and no S4.  No murmur heard. Pulmonary/Chest: Effort normal and breath sounds normal. No stridor. She has no wheezes. She has no rales.  Abdominal: Soft. Bowel sounds are normal. She exhibits no distension. There is no abdominal tenderness.  Musculoskeletal:        General: No edema.     Cervical back: Neck supple.  Neurological: She is alert and oriented to person, place, and time. She has intact cranial nerves (2-12).  Skin: Skin is warm and moist.   CARDIAC DATABASE: EKG: 12/28/2021: NSR, 71bpm, frequent PACs, LAE, consider anteroseptal infarct, non-specific T wave abnormalities.   Echocardiogram: No results found for this or any previous visit from the past 1095 days.    Stress Testing: No results found for this or any previous visit from the past 1095 days.   Heart Catheterization: None  CAC  scoring 03/28/2017 Coronary artery calcium score is 0. Small hiatal hernia. Question a 4 mm nodule in the left lower lobe. No follow-up needed if patient is low-risk. Non-contrast chest CT can be considered in 12 months if patient is high-risk.  LABORATORY DATA: External Labs: Collected: 03/31/2021. BUN 15, creatinine 0.9. Sodium 141, potassium 4.3, chloride 106, bicarb 20. AST 15, ALT 15, alkaline phosphatase 74. Hemoglobin 14.4 g/dL, hematocrit 42.5%. Total cholesterol 225, triglycerides 100, HDL 55, LDL 149, non-HDL 169. Apolipoprotein B 103 (upper limit of normal 90)  IMPRESSION:    ICD-10-CM   1. Abnormal EKG  R94.31 PCV ECHOCARDIOGRAM COMPLETE    LONG TERM MONITOR (3-14 DAYS)    2. Premature atrial complex  I49.1 EKG 12-Lead    LONG TERM MONITOR (3-14 DAYS)    Hemoglobin and hematocrit, blood    TSH       RECOMMENDATIONS: Donna [name] is a 76 y.o. Caucasian female whose past medical history and cardiac risk factors include: History of bladder cancer 2020 (cystoscopy/BCG treatment), hyperlipidemia, osteopenia, postmenopausal female.  Referred to the practice for evaluation  of premature atrial contractions.  She is clinically asymptomatic.  Shared decision was to proceed with a 3-day extended Holter monitor to quantify PAC burden and evaluate for other dysrhythmias.  Plan echocardiogram to evaluate for structural heart disease, LVEF, and diastolic function.  Further recommendations to follow after the results of the extended Holter monitor and echocardiogram.  Check a TSH and hemoglobin/hematocrit.  Otherwise no identifiable reversible causes.  She had a coronary calcium score in 2019 which noted a total CAC of 0.  A 4 mm lung nodule was also reported.  She has former history of smoking and recently had frequent episodes of coughing.  I have asked her to discuss this further with PCP and see pulmonary evaluation is warranted.   In addition, she had labs in January  2023 which were reviewed as part of today's office visit.  They were provided by PCP.  Her LDL was 149 mg/dL.  I have educated her on the importance of diet restrictions of high cholesterol rich foods.  She will have this rechecked with her yearly physical in January 2024 and follow the recommendations of her PCP.  Data Reviewed: I have independently reviewed external notes provided by the referring provider as part of this office visit.   I have independently reviewed EKG, labs as part of medical decision making. I have ordered the following tests:  Orders Placed This Encounter  Procedures   Hemoglobin and hematocrit, blood   TSH   LONG TERM MONITOR (3-14 DAYS)    Standing Status:   Future    Number of Occurrences:   1    Order Specific Question:   Where should this test be performed?    Answer:   [facility]    Order Specific Question:   Does the patient have an implanted cardiac device?    Answer:   No    Order Specific Question:   Prescribed days of wear    Answer:   3    Order Specific Question:   Type of enrollment    Answer:   Clinic Enrollment   EKG 12-Lead   [facility] ECHOCARDIOGRAM COMPLETE    Standing Status:   Future    Standing Expiration Date:   12/29/2022  I have made no medications changes at today's encounter as noted above.  FINAL MEDICATION LIST END OF ENCOUNTER: No orders of the defined types were placed in this encounter.   There are no discontinued medications.   Current Outpatient Medications:    cetirizine (ZYRTEC) 10 MG tablet, Take 10 mg by mouth every morning. , Disp: , Rfl:    cholecalciferol (VITAMIN D) 1000 units tablet, Take 1,000 Units by mouth daily., Disp: , Rfl:    guaiFENesin (MUCUS RELIEF ADULT PO), Take by mouth as needed., Disp: , Rfl:    pantoprazole (PROTONIX) 40 MG tablet, Take 1 tablet (40 mg total) by mouth daily., Disp: 90 tablet, Rfl: 3 No current facility-administered medications for this visit.  Facility-Administered Medications  Ordered in Other Visits:    mitomycin (MUTAMYCIN) chemo injection 40 mg, 40 mg, Bladder Instillation, Once, Raynelle Bring, MD  Orders Placed This Encounter  Procedures   Hemoglobin and hematocrit, blood   TSH   LONG TERM MONITOR (3-14 DAYS)   EKG 12-Lead   [facility] ECHOCARDIOGRAM COMPLETE   There are no Patient Instructions on file for this visit.   --Continue cardiac medications as reconciled in final medication list. --Return in about 8 weeks (around 02/22/2022) for Follow up PACs. or sooner  if needed. --Continue follow-up with your primary care physician regarding the management of your other chronic comorbid conditions.  Patient's questions and concerns were addressed to her satisfaction. She voices understanding of the instructions provided during this encounter.   This note was created using a voice recognition software as a result there may be grammatical errors inadvertently enclosed that do not reflect the nature of this encounter. Every attempt is made to correct such errors.  Rex Kras, [place], [facility]  Pager: [phone] Office: [phone]

## 2022-01-05 DIAGNOSIS — R9431 Abnormal electrocardiogram [ECG] [EKG]: Secondary | ICD-10-CM | POA: Diagnosis not present

## 2022-01-05 DIAGNOSIS — I491 Atrial premature depolarization: Secondary | ICD-10-CM | POA: Diagnosis not present

## 2022-01-07 ENCOUNTER — Ambulatory Visit: Payer: Self-pay

## 2022-01-07 DIAGNOSIS — R9431 Abnormal electrocardiogram [ECG] [EKG]: Secondary | ICD-10-CM

## 2022-01-08 DIAGNOSIS — I491 Atrial premature depolarization: Secondary | ICD-10-CM | POA: Diagnosis not present

## 2022-01-08 DIAGNOSIS — R9431 Abnormal electrocardiogram [ECG] [EKG]: Secondary | ICD-10-CM | POA: Diagnosis not present

## 2022-02-07 DIAGNOSIS — H5203 Hypermetropia, bilateral: Secondary | ICD-10-CM | POA: Diagnosis not present

## 2022-02-22 ENCOUNTER — Ambulatory Visit: Payer: Self-pay | Admitting: Cardiology

## 2022-02-22 ENCOUNTER — Encounter: Payer: Self-pay | Admitting: Cardiology

## 2022-02-22 VITALS — BP 143/79 | HR 73 | Resp 14 | Ht 67.0 in | Wt 158.4 lb

## 2022-02-22 DIAGNOSIS — E7800 Pure hypercholesterolemia, unspecified: Secondary | ICD-10-CM

## 2022-02-22 DIAGNOSIS — Z712 Person consulting for explanation of examination or test findings: Secondary | ICD-10-CM | POA: Diagnosis not present

## 2022-02-22 DIAGNOSIS — I491 Atrial premature depolarization: Secondary | ICD-10-CM

## 2022-02-22 MED ORDER — DILTIAZEM HCL ER COATED BEADS 180 MG PO CP24: 180.0000 mg | ORAL_CAPSULE | Freq: Every morning | ORAL | 1 refills | Status: DC

## 2022-02-22 NOTE — Progress Notes (Addendum)
ID:  Donna [name], DOB 24-Aug-1945, MRN [id number]  PCP:  Haywood Pao, MD  Cardiologist:  Rex Kras, DO, [facility] (established care 12/28/2021)  Date: 02/22/22 Last Office Visit: 12/28/2021  Chief Complaint  Patient presents with   Follow-up    8 weeks Palpitations and PACs    HPI  Donna [name] is a 76 y.o. Caucasian female whose past medical history and cardiovascular risk factors include: History of bladder cancer 2020 (cystoscopy/BCG treatment), hyperlipidemia, osteopenia, postmenopausal female.  In August 2023 patient was having an episode of URI and was having excessive amount of coughing spells.  She followed up with her PCP and on auscultation was noted to have extra beats.  This prompted an EKG which noted sinus rhythm with question of bigeminy.  At that time she was referred to cardiology.  However, the August 2023 referral just became fruitful in October 2023.  At the last office visit the shared decision was to proceed with a 3-day cardiac monitor to evaluate for PVC burden.  Patient was noted to be in sinus rhythm during the monitoring period but did have a PAC burden of  36.7%.   In addition, she was recommended to have labs including TSH and hemoglobin to evaluate for reversible causes these are pending. She will have it done in Jan 2024 w/ her yearly physical.   Since last office visit, she denies anginal discomfort or heart symptoms.  Patient states that her palpitations and her URI symptoms have also resolved.  FUNCTIONAL STATUS: Enjoys doing Pilates at least twice a week, goes out for a walk once or twice a week as well.  She continues to work as Forensic psychologist.  ALLERGIES: No Known Allergies  MEDICATION LIST PRIOR TO VISIT: Current Meds  Medication Sig   cetirizine (ZYRTEC) 10 MG tablet Take 10 mg by mouth every morning.    cholecalciferol (VITAMIN D) 1000 units tablet Take 1,000 Units by mouth daily.   diltiazem (CARDIZEM CD) 180 MG 24  hr capsule Take 1 capsule (180 mg total) by mouth every morning.   guaiFENesin (MUCUS RELIEF ADULT PO) Take by mouth as needed.   pantoprazole (PROTONIX) 40 MG tablet Take 1 tablet (40 mg total) by mouth daily.     PAST MEDICAL HISTORY: Past Medical History:  Diagnosis Date   Bladder cancer [facility]) urologist-- dr Alinda Money   first dx in 2000-- papillary non-invasive , s/p multiple TURBT's w/ Mitomyocin C   Hiatal hernia    History of colon polyps    Osteopenia 2020   T score -1.3 FRAX 9% / 1%   Wears contact lenses     PAST SURGICAL HISTORY: Past Surgical History:  Procedure Laterality Date   COLONOSCOPY     CT CARDIAC SCORING  03/28/2017   SCORE= 0   CYSTOSCOPY W/ RETROGRADES  07/04/2011   Procedure: CYSTOSCOPY WITH RETROGRADE PYELOGRAM;  Surgeon: Dutch Gray, MD;  Location: [facility];  Service: Urology;  Laterality: Bilateral;   CYSTOSCOPY W/ URETERAL STENT PLACEMENT Bilateral 05/27/2013   Procedure: CYSTOSCOPY WITH RETROGRADE PYELOGRAM/URETERAL STENT PLACEMENT LEFT;  Surgeon: Dutch Gray, MD;  Location: [facility];  Service: Urology;  Laterality: Bilateral;   TRANSURETHRAL RESECTION OF BLADDER TUMOR  07/04/2011   Procedure: TRANSURETHRAL RESECTION OF BLADDER TUMOR (TURBT);  Surgeon: Dutch Gray, MD;  [facility]: [facility];  Service: Urology;  Laterality: N/A;   TRANSURETHRAL RESECTION OF BLADDER TUMOR N/A 05/27/2013   Procedure: TRANSURETHRAL RESECTION OF BLADDER TUMOR (TURBT);  Surgeon: Dutch Gray, MD;  [facility];  Service: Urology;  Laterality: N/A;   TRANSURETHRAL RESECTION OF BLADDER TUMOR Bilateral 06/05/2017   Procedure: TRANSURETHRAL RESECTION OF BLADDER TUMOR (TURBT)/ CYSTSCOPY/ BILATERAL RETROGADE [id number];  Surgeon: Raynelle Bring, MD;  [facility]: [facility];  Service: Urology;  Laterality: Bilateral;  GENERAL ANESTHESIA WITH PARALYSIS   TRANSURETHRAL RESECTION OF BLADDER TUMOR WITH MITOMYCIN-C  x2 in 2000;  x2 in 2001;  2002;  2003;  2005;  2006   dr humphries   UPPER GASTROINTESTINAL  ENDOSCOPY      FAMILY HISTORY: The patient family history includes Cancer in her brother; Cholelithiasis in her sister; Heart attack in her mother; Stomach cancer in her father.  SOCIAL HISTORY:  The patient  reports that she quit smoking about 40 years ago. Her smoking use included cigarettes. She has a 5.00 pack-year smoking history. She has never used smokeless tobacco. She reports current alcohol use. She reports that she does not use drugs.  REVIEW OF SYSTEMS: Review of Systems  Cardiovascular:  Negative for chest pain, claudication, dyspnea on exertion, irregular heartbeat, leg swelling, near-syncope, orthopnea, palpitations, paroxysmal nocturnal dyspnea and syncope.  Respiratory:  Negative for shortness of breath.   Hematologic/Lymphatic: Negative for bleeding problem.  Musculoskeletal:  Negative for muscle cramps and myalgias.  Neurological:  Negative for dizziness and light-headedness.    PHYSICAL EXAM:    02/22/2022   10:29 AM 12/28/2021   10:47 AM 09/27/2021   10:23 AM  Vitals with BMI  Height '5\' 7"'$  '5\' 7"'$  '5\' 7"'$   Weight 158 lbs 6 oz 159 lbs 10 oz 157 lbs 8 oz  BMI 24.8 73.71 06.26  Systolic 948 546 270  Diastolic 79 73 76  Pulse 73 73 64    Physical Exam  Constitutional: No distress.  Age appropriate, hemodynamically stable.   Neck: No JVD present.  Cardiovascular: Normal rate, regular rhythm, S1 normal, S2 normal, intact distal pulses and normal pulses. Exam reveals no gallop, no S3 and no S4.  No murmur heard. Pulmonary/Chest: Effort normal and breath sounds normal. No stridor. She has no wheezes. She has no rales.  Abdominal: Soft. Bowel sounds are normal. She exhibits no distension. There is no abdominal tenderness.  Musculoskeletal:        General: No edema.     Cervical back: Neck supple.  Neurological: She is alert and oriented to person, place, and time. She has intact cranial nerves (2-12).  Skin: Skin is warm and moist.   CARDIAC  DATABASE: EKG: 12/28/2021: NSR, 71bpm, frequent PACs, LAE, consider anteroseptal infarct, non-specific T wave abnormalities.   Echocardiogram: 01/07/2022: Normal LV systolic function with visual EF 60-65%. Left ventricle cavity is normal in size. Normal left ventricular wall thickness. Normal global wall motion. Normal diastolic filling pattern, normal LAP.  Mild tricuspid regurgitation. No evidence of pulmonary hypertension. Mild pulmonic regurgitation. No prior study for comparison.  Stress Testing: No results found for this or any previous visit from the past 1095 days.   Heart Catheterization: None  CAC scoring 03/28/2017 Coronary artery calcium score is 0. Small hiatal hernia. Question a 4 mm nodule in the left lower lobe. No follow-up needed if patient is low-risk. Non-contrast chest CT can be considered in 12 months if patient is high-risk.  Cardiac monitor (Zio Patch): December 28, 2021-December 31, 2021 Dominant rhythm sinus. Heart rate 52-138 bpm. Avg HR 68 bpm. No atrial fibrillation, ventricular tachycardia, high grade AV block, pauses (3 seconds or longer). Total ventricular ectopic burden <1%. Total supraventricular ectopic burden  36.7% (predominantly isolated beats). Patient triggered events: 0.  LABORATORY DATA: External Labs: Collected: 03/31/2021. BUN 15, creatinine 0.9. Sodium 141, potassium 4.3, chloride 106, bicarb 20. AST 15, ALT 15, alkaline phosphatase 74. Hemoglobin 14.4 g/dL, hematocrit 42.5%. Total cholesterol 225, triglycerides 100, HDL 55, LDL 149, non-HDL 169. Apolipoprotein B 103 (upper limit of normal 90)  IMPRESSION:    ICD-10-CM   1. Premature atrial complex  I49.1 LONG TERM MONITOR (3-14 DAYS)    diltiazem (CARDIZEM CD) 180 MG 24 hr capsule    2. Pure hypercholesterolemia  E78.00     3. Encounter to discuss test results  Z71.2        RECOMMENDATIONS: Donna [name] is a 76 y.o. Caucasian female whose past medical history and  cardiac risk factors include: History of bladder cancer 2020 (cystoscopy/BCG treatment), hyperlipidemia, osteopenia, postmenopausal female.  Premature atrial complex Noted by her PCP while she was having a URI exacerbation. She was referred to cardiology and she had a cardiac monitor which noted a PAC burden close to 36.7% Though she is asymptomatic at this time the shared decision was to start diltiazem 180 mg p.o. daily. Prior to her next office visit we will repeat a monitor. If she is asymptomatic with regards to palpitations I have asked her to hold the diltiazem a week prior to getting the monitor to reevaluate her PVC burden.   However, if she is continuing to have palpitations on the current dose of diltiazem she is requested to continue the medications while wearing the monitor to see if additional medication titration is warranted.  She can always call the office for further guidance if needed. She will have labs with PCP in January 2024 and will send [place] a copy for reference  Pure hypercholesterolemia Last LDL levels were 149 mg/dL. Patient is working on dietary restrictions and will have the labs rechecked in January 2024 prior to considering pharmacological therapy. Currently managed by primary care provider.   FINAL MEDICATION LIST END OF ENCOUNTER: Meds ordered this encounter  Medications   diltiazem (CARDIZEM CD) 180 MG 24 hr capsule    Sig: Take 1 capsule (180 mg total) by mouth every morning.    Dispense:  90 capsule    Refill:  1    There are no discontinued medications.   Current Outpatient Medications:    cetirizine (ZYRTEC) 10 MG tablet, Take 10 mg by mouth every morning. , Disp: , Rfl:    cholecalciferol (VITAMIN D) 1000 units tablet, Take 1,000 Units by mouth daily., Disp: , Rfl:    diltiazem (CARDIZEM CD) 180 MG 24 hr capsule, Take 1 capsule (180 mg total) by mouth every morning., Disp: 90 capsule, Rfl: 1   guaiFENesin (MUCUS RELIEF ADULT PO), Take by mouth as  needed., Disp: , Rfl:    pantoprazole (PROTONIX) 40 MG tablet, Take 1 tablet (40 mg total) by mouth daily., Disp: 90 tablet, Rfl: 3 No current facility-administered medications for this visit.  Facility-Administered Medications Ordered in Other Visits:    mitomycin (MUTAMYCIN) chemo injection 40 mg, 40 mg, Bladder Instillation, Once, Raynelle Bring, MD  Orders Placed This Encounter  Procedures   LONG TERM MONITOR (3-14 DAYS)   There are no Patient Instructions on file for this visit.   --Continue cardiac medications as reconciled in final medication list. --Return in about 6 months (around 08/24/2022) for Follow up, Palpitations,PACs. or sooner if needed. --Continue follow-up with your primary care physician regarding the management of your other chronic comorbid conditions.  Patient's questions and concerns were addressed to her satisfaction. She voices understanding of the instructions provided during this encounter.   This note was created using a voice recognition software as a result there may be grammatical errors inadvertently enclosed that do not reflect the nature of this encounter. Every attempt is made to correct such errors.  Rex Kras, [place], [facility]  Pager: [phone] Office: [phone]

## 2022-03-28 DIAGNOSIS — Z85828 Personal history of other malignant neoplasm of skin: Secondary | ICD-10-CM | POA: Diagnosis not present

## 2022-03-28 DIAGNOSIS — L821 Other seborrheic keratosis: Secondary | ICD-10-CM | POA: Diagnosis not present

## 2022-03-28 DIAGNOSIS — D2372 Other benign neoplasm of skin of left lower limb, including hip: Secondary | ICD-10-CM | POA: Diagnosis not present

## 2022-03-28 DIAGNOSIS — D2272 Melanocytic nevi of left lower limb, including hip: Secondary | ICD-10-CM | POA: Diagnosis not present

## 2022-05-02 DIAGNOSIS — K080 Exfoliation of teeth due to systemic causes: Secondary | ICD-10-CM | POA: Diagnosis not present

## 2022-05-11 DIAGNOSIS — R7989 Other specified abnormal findings of blood chemistry: Secondary | ICD-10-CM | POA: Diagnosis not present

## 2022-05-11 DIAGNOSIS — K219 Gastro-esophageal reflux disease without esophagitis: Secondary | ICD-10-CM | POA: Diagnosis not present

## 2022-05-11 DIAGNOSIS — E559 Vitamin D deficiency, unspecified: Secondary | ICD-10-CM | POA: Diagnosis not present

## 2022-05-11 DIAGNOSIS — E7800 Pure hypercholesterolemia, unspecified: Secondary | ICD-10-CM | POA: Diagnosis not present

## 2022-05-11 DIAGNOSIS — N1832 Chronic kidney disease, stage 3b: Secondary | ICD-10-CM | POA: Diagnosis not present

## 2022-05-11 DIAGNOSIS — Z1212 Encounter for screening for malignant neoplasm of rectum: Secondary | ICD-10-CM | POA: Diagnosis not present

## 2022-05-18 DIAGNOSIS — E7800 Pure hypercholesterolemia, unspecified: Secondary | ICD-10-CM | POA: Diagnosis not present

## 2022-05-18 DIAGNOSIS — Z1339 Encounter for screening examination for other mental health and behavioral disorders: Secondary | ICD-10-CM | POA: Diagnosis not present

## 2022-05-18 DIAGNOSIS — C679 Malignant neoplasm of bladder, unspecified: Secondary | ICD-10-CM | POA: Diagnosis not present

## 2022-05-18 DIAGNOSIS — R82998 Other abnormal findings in urine: Secondary | ICD-10-CM | POA: Diagnosis not present

## 2022-05-18 DIAGNOSIS — Z1331 Encounter for screening for depression: Secondary | ICD-10-CM | POA: Diagnosis not present

## 2022-05-18 DIAGNOSIS — I491 Atrial premature depolarization: Secondary | ICD-10-CM | POA: Diagnosis not present

## 2022-05-18 DIAGNOSIS — N1832 Chronic kidney disease, stage 3b: Secondary | ICD-10-CM | POA: Diagnosis not present

## 2022-05-18 DIAGNOSIS — Z0000 Encounter for general adult medical examination without abnormal findings: Secondary | ICD-10-CM | POA: Diagnosis not present

## 2022-05-23 DIAGNOSIS — H5213 Myopia, bilateral: Secondary | ICD-10-CM | POA: Diagnosis not present

## 2022-05-28 ENCOUNTER — Other Ambulatory Visit: Payer: Self-pay | Admitting: Cardiology

## 2022-05-28 DIAGNOSIS — I491 Atrial premature depolarization: Secondary | ICD-10-CM

## 2022-06-20 ENCOUNTER — Encounter: Payer: Self-pay | Admitting: Internal Medicine

## 2022-06-20 ENCOUNTER — Other Ambulatory Visit (HOSPITAL_COMMUNITY): Payer: Self-pay | Admitting: Internal Medicine

## 2022-06-20 DIAGNOSIS — M25472 Effusion, left ankle: Secondary | ICD-10-CM | POA: Diagnosis not present

## 2022-06-21 ENCOUNTER — Ambulatory Visit (HOSPITAL_COMMUNITY)
Admission: RE | Admit: 2022-06-21 | Discharge: 2022-06-21 | Disposition: A | Discharge status: Home / Self Care | Payer: Medicare Other | Source: Ambulatory Visit | Attending: Internal Medicine | Admitting: Internal Medicine

## 2022-06-21 DIAGNOSIS — M25472 Effusion, left ankle: Secondary | ICD-10-CM | POA: Diagnosis not present

## 2022-06-21 NOTE — Progress Notes (Signed)
Left lower extremity venous study completed.   Preliminary results left as a message with Medical Assistant, Brayton Caves at Dr. Deneen Harts office.  Please see CV Procedures for preliminary results.  Keisuke Hollabaugh, RVT  1:07 PM 06/21/22

## 2022-07-05 DIAGNOSIS — Z8551 Personal history of malignant neoplasm of bladder: Secondary | ICD-10-CM | POA: Diagnosis not present

## 2022-07-05 DIAGNOSIS — R8271 Bacteriuria: Secondary | ICD-10-CM | POA: Diagnosis not present

## 2022-07-08 ENCOUNTER — Ambulatory Visit: Payer: Medicare Other | Admitting: Nurse Practitioner

## 2022-07-08 ENCOUNTER — Encounter: Payer: Self-pay | Admitting: Nurse Practitioner

## 2022-07-08 VITALS — BP 110/64 | HR 71 | Ht 67.0 in | Wt 158.0 lb

## 2022-07-08 DIAGNOSIS — K625 Hemorrhage of anus and rectum: Secondary | ICD-10-CM | POA: Diagnosis not present

## 2022-07-08 DIAGNOSIS — K649 Unspecified hemorrhoids: Secondary | ICD-10-CM

## 2022-07-08 DIAGNOSIS — K219 Gastro-esophageal reflux disease without esophagitis: Secondary | ICD-10-CM

## 2022-07-08 NOTE — Patient Instructions (Addendum)
Apply a small amount of Desitin inside the anal opening and to the external anal area three times daily as needed for anal or hemorrhoidal irritation/bleeding.   Apply a small amount of Vaseline to the external hemorrhoids prior to exercise/golfing   Benefiber 1 tablespoon once daily to avoid straining   Contact our office if your rectal bleeding worsens   Continue Pantoprazole 40mg  once daily to be taken 30 minutes before breakfast.  Thank you for trusting me with your gastrointestinal care!   Alcide Evener, CRNP

## 2022-07-08 NOTE — Progress Notes (Signed)
07/08/2022 Donna [name] [id number] 1945-05-18   Chief Complaint: Hemorrhoids   History of Present Illness: Donna Lorenzetti. [name] is a 77 year old female with a past medical history of hyperlipidemia, osteopenia, bladder cancer s/p resection 2013, hiatal hernia, GERD and hemorrhoids. She was last seen in office 09/27/2021 by Dr. Lavon Paganini for GERD and hemorrhoid follow up. At that time, she had intermittent breakthrough GERD symptoms with instructions to continue Pantoprazole 40mg  QD and to take Famotidine PRN. If she continued to have persistent breakthrough GERD symptoms and cough to consider a future EGD with 48-hour pH Bravo study for further evaluation. She was also advised to maintain a lactose free diet, use Benefiber 1 tablespoon daily for IBS and Analpram PRN for hemorrhoidal symptoms. Father with history of stomach cancer.  He presents today for further follow-up.  Her reflux symptoms are well-controlled on pantoprazole 40 mg daily.  She infrequently takes Pepcid.  She denies having any upper or lower abdominal pain.  She has intermittent hemorrhoidal pain and bleeding if she strains and she has noticeable hemorrhoidal irritation if she exercises i.e. goes golfing.  She eats a fairly healthy diet.  She takes a probiotic daily.  She drinks 6 to 8 glasses of water daily.  Her most recent colonoscopy was in 2017 which showed diverticulosis and internal hemorrhoids, no polyps.  She is currently on antibiotics for UTI.     Latest Ref Rng & Units 11/09/2018   10:58 PM 06/02/2017    9:50 AM 05/24/2013   10:05 AM  CBC  WBC 4.0 - 10.5 K/uL 3.8  4.8  4.7   Hemoglobin 12.0 - 15.0 g/dL 40.9  81.1  91.4   Hematocrit 36.0 - 46.0 % 43.7  42.9  44.0   Platelets 150 - 400 K/uL 193  283  241     Coronary calcium CT 05/13/2021: 1. Coronary calcium score of 0. 2. Stable left lower lobe nodule posterior to the major fissure. This has been stable since January, 2019 and is therefore benign. Current  Medications, Allergies, Past Medical History, Past Surgical History, Family History and Social History were reviewed in [employer] record.  PAST GI PROCEDURES:  December 29, 2015 colonoscopy: Diverticulosis and internal hemorrhoids, no polyps.   EGD August 2012: Hiatal hernia, esophageal stricture dilated   EGD July 2002: Cardia nodule biopsied, showed benign mucosa.  H. pylori test negative  Current Outpatient Medications on File Prior to Visit  Medication Sig Dispense Refill   CARTIA XT 180 MG 24 hr capsule TAKE 1 CAPSULE(180 MG) BY MOUTH EVERY MORNING 90 capsule 1   cetirizine (ZYRTEC) 10 MG tablet Take 10 mg by mouth every morning.      cholecalciferol (VITAMIN D) 1000 units tablet Take 1,000 Units by mouth daily.     guaiFENesin (MUCUS RELIEF ADULT PO) Take by mouth as needed.     pantoprazole (PROTONIX) 40 MG tablet Take 1 tablet (40 mg total) by mouth daily. 90 tablet 3   sulfamethoxazole-trimethoprim (BACTRIM DS) 800-160 MG tablet Take 1 tablet by mouth 2 (two) times daily.     Current Facility-Administered Medications on File Prior to Visit  Medication Dose Route Frequency Provider Last Rate Last Admin   mitomycin (MUTAMYCIN) chemo injection 40 mg  40 mg Bladder Instillation Once Heloise Purpura, MD       No Known Allergies   Review of Systems:   Constitutional: Negative for fever, sweats, chills or weight loss.  Respiratory: Negative for shortness  of breath.   Cardiovascular: Negative for chest pain, palpitations and leg swelling.  Gastrointestinal: See HPI.  Musculoskeletal: Negative for back pain or muscle aches.  Neurological: Negative for dizziness, headaches or paresthesias.    Physical Exam: BP 110/64   Pulse 71   Ht 5\' 7"  (1.702 m)   Wt 158 lb (71.7 kg)   BMI 24.75 kg/m  General: 77 year old female in no acute distress. Head: Normocephalic and atraumatic. Eyes: No scleral icterus. Conjunctiva pink . Ears: Normal auditory  acuity. Mouth: Dentition intact. No ulcers or lesions.  Lungs: Clear throughout to auscultation. Heart: Regular rate and rhythm, no murmur. Abdomen: Soft, nontender and nondistended. No masses or hepatomegaly. Normal bowel sounds x 4 quadrants.  Rectal: Friable anterior external hemorrhoid without active bleeding.  Internal hemorrhoids without prolapse.  Two small anal tags to the posterior anal area. DD CMA present during exam.  Musculoskeletal: Symmetrical with no gross deformities. Extremities: No edema. Neurological: Alert oriented x 4. No focal deficits.  Psychological: Alert and cooperative. Normal mood and affect  Assessment and Recommendations:  77 year old female with rectal bleeding likely due to external/internal hemorrhoids.  Inflamed friable external hemorrhoid on exam. -Apply a small amount of Desitin inside the anal opening and to the external anal area three times daily as needed for anal or hemorrhoidal irritation/bleeding.  -Benefiber 1 tablespoon daily -Apply a small amount of Vaseline to the anal area/external hemorrhoids prior to exercise -Patient to monitor rectal bleeding, contact office if symptoms worsen. Consider a diagnostic colonoscopy if rectal bleeding persists or worsens  Colon cancer screening.  Colonoscopy 12/2015 showed diverticulosis and internal hemorrhoids, no polyps.  GERD, stable on Pantoprazole 40 mg daily Continue pantoprazole 40 mg daily. Famotidine 20 mg p.o.QHS PRN

## 2022-07-12 DIAGNOSIS — Z8551 Personal history of malignant neoplasm of bladder: Secondary | ICD-10-CM | POA: Diagnosis not present

## 2022-07-12 DIAGNOSIS — R8289 Other abnormal findings on cytological and histological examination of urine: Secondary | ICD-10-CM | POA: Diagnosis not present

## 2022-07-20 NOTE — Progress Notes (Signed)
DD, pls contact the patient and let her know Dr. Lavon Paganini recommended scheduling her for a colonoscopy if she is willing to do so. Pls schedule her for a colonoscopy. Please let me know if patient does not wish to do. THX.

## 2022-07-20 NOTE — Progress Notes (Signed)
Reviewed and agree with documentation and assessment and plan. K. Veena Janae Bonser , MD   

## 2022-07-22 ENCOUNTER — Telehealth: Payer: Self-pay

## 2022-07-22 DIAGNOSIS — K649 Unspecified hemorrhoids: Secondary | ICD-10-CM

## 2022-07-22 DIAGNOSIS — K219 Gastro-esophageal reflux disease without esophagitis: Secondary | ICD-10-CM

## 2022-07-22 DIAGNOSIS — K625 Hemorrhage of anus and rectum: Secondary | ICD-10-CM

## 2022-07-22 MED ORDER — NA SULFATE-K SULFATE-MG SULF 17.5-3.13-1.6 GM/177ML PO SOLN: 1.0000 | Freq: Once | ORAL | 0 refills | Status: AC

## 2022-07-22 NOTE — Telephone Encounter (Signed)
Patient returning DD's call.

## 2022-07-22 NOTE — Addendum Note (Signed)
Addended by: Rise Paganini on: 07/22/2022 11:09 AM   Modules accepted: Orders

## 2022-07-22 NOTE — Telephone Encounter (Signed)
Contacted pt & pt is scheduled for Colonoscopy with Nandigam for 08/02/22 at 3:30 pm. Orders were placed and instructions were sent via MyChart & mail.

## 2022-07-22 NOTE — Telephone Encounter (Signed)
Contacted pt & LVM to return call 

## 2022-08-02 ENCOUNTER — Ambulatory Visit (AMBULATORY_SURGERY_CENTER): Payer: Medicare Other | Admitting: Gastroenterology

## 2022-08-02 ENCOUNTER — Encounter: Payer: Self-pay | Admitting: Gastroenterology

## 2022-08-02 VITALS — BP 118/78 | HR 68 | Temp 97.5°F | Resp 12 | Ht 67.0 in | Wt 158.0 lb

## 2022-08-02 DIAGNOSIS — Z09 Encounter for follow-up examination after completed treatment for conditions other than malignant neoplasm: Secondary | ICD-10-CM

## 2022-08-02 DIAGNOSIS — Z86010 Personal history of colonic polyps: Secondary | ICD-10-CM | POA: Diagnosis not present

## 2022-08-02 DIAGNOSIS — D120 Benign neoplasm of cecum: Secondary | ICD-10-CM

## 2022-08-02 DIAGNOSIS — D127 Benign neoplasm of rectosigmoid junction: Secondary | ICD-10-CM | POA: Diagnosis not present

## 2022-08-02 DIAGNOSIS — K635 Polyp of colon: Secondary | ICD-10-CM | POA: Diagnosis not present

## 2022-08-02 DIAGNOSIS — D123 Benign neoplasm of transverse colon: Secondary | ICD-10-CM | POA: Diagnosis not present

## 2022-08-02 MED ORDER — SODIUM CHLORIDE 0.9 % IV SOLN: 500.0000 mL | Freq: Once | INTRAVENOUS | Status: DC

## 2022-08-02 MED ORDER — HYDROCORTISONE ACETATE 25 MG RE SUPP: 25.0000 mg | Freq: Every day | RECTAL | 0 refills | Status: DC

## 2022-08-02 NOTE — Progress Notes (Signed)
[facility] History and Physical   Primary Care Physician:  Tisovec, Adelfa Koh, MD   Reason for Procedure:  History of adenomatous colon polyps  Plan:    Surveillance colonoscopy with possible interventions as needed     HPI: Donna [name] is a very pleasant 77 y.o. female here for surveillance colonoscopy. Denies any nausea, vomiting, abdominal pain, melena or bright red blood per rectum  The risks and benefits as well as alternatives of endoscopic procedure(s) have been discussed and reviewed. All questions answered. The patient agrees to proceed.    Past Medical History:  Diagnosis Date   Bladder cancer [facility]) urologist-- dr Laverle Patter   first dx in 2000-- papillary non-invasive , s/p multiple TURBT's w/ Mitomyocin C   Hiatal hernia    History of colon polyps    Osteopenia 2020   T score -1.3 FRAX 9% / 1%   Wears contact lenses     Past Surgical History:  Procedure Laterality Date   COLONOSCOPY     CT CARDIAC SCORING  03/28/2017   SCORE= 0   CYSTOSCOPY W/ RETROGRADES  07/04/2011   Procedure: CYSTOSCOPY WITH RETROGRADE PYELOGRAM;  Surgeon: Crecencio Mc, MD;  Location: [facility];  Service: Urology;  Laterality: Bilateral;   CYSTOSCOPY W/ URETERAL STENT PLACEMENT Bilateral 05/27/2013   Procedure: CYSTOSCOPY WITH RETROGRADE PYELOGRAM/URETERAL STENT PLACEMENT LEFT;  Surgeon: Crecencio Mc, MD;  [facility];  Service: Urology;  Laterality: Bilateral;   TRANSURETHRAL RESECTION OF BLADDER TUMOR  07/04/2011   Procedure: TRANSURETHRAL RESECTION OF BLADDER TUMOR (TURBT);  Surgeon: Crecencio Mc, MD;  [facility];  Service: Urology;  Laterality: N/A;   TRANSURETHRAL RESECTION OF BLADDER TUMOR N/A 05/27/2013   Procedure: TRANSURETHRAL RESECTION OF BLADDER TUMOR (TURBT);  Surgeon: Crecencio Mc, MD;  [facility];  Service: Urology;  Laterality: N/A;   TRANSURETHRAL RESECTION OF BLADDER TUMOR Bilateral 06/05/2017   Procedure: TRANSURETHRAL RESECTION OF BLADDER TUMOR (TURBT)/  CYSTSCOPY/ BILATERAL RETROGADE [id number];  Surgeon: Heloise Purpura, MD;  [facility];  Service: Urology;  Laterality: Bilateral;  GENERAL ANESTHESIA WITH PARALYSIS   TRANSURETHRAL RESECTION OF BLADDER TUMOR WITH MITOMYCIN-C  x2 in 2000;  x2 in 2001;  2002;  2003;  2005;  2006   dr humphries   UPPER GASTROINTESTINAL ENDOSCOPY      Prior to Admission medications   Medication Sig Start Date End Date Taking? Authorizing Provider  CARTIA XT 180 MG 24 hr capsule TAKE 1 CAPSULE(180 MG) BY MOUTH EVERY MORNING 05/30/22  Yes Tolia, Sunit, DO  cetirizine (ZYRTEC) 10 MG tablet Take 10 mg by mouth every morning.    Yes [provider]  cholecalciferol (VITAMIN D) 1000 units tablet Take 1,000 Units by mouth daily.   Yes [provider]  pantoprazole (PROTONIX) 40 MG tablet Take 1 tablet (40 mg total) by mouth daily. 09/27/21  Yes Selestino Nila, Eleonore Chiquito, MD  guaiFENesin (MUCUS RELIEF ADULT PO) Take by mouth as needed.    [provider]    Current Outpatient Medications  Medication Sig Dispense Refill   CARTIA XT 180 MG 24 hr capsule TAKE 1 CAPSULE(180 MG) BY MOUTH EVERY MORNING 90 capsule 1   cetirizine (ZYRTEC) 10 MG tablet Take 10 mg by mouth every morning.      cholecalciferol (VITAMIN D) 1000 units tablet Take 1,000 Units by mouth daily.     pantoprazole (PROTONIX) 40 MG tablet Take 1 tablet (40 mg total) by mouth daily. 90 tablet 3   guaiFENesin (MUCUS RELIEF ADULT PO)  Take by mouth as needed.     Current Facility-Administered Medications  Medication Dose Route Frequency Provider Last Rate Last Admin   0.9 %  sodium chloride infusion  500 mL Intravenous Once Nell Gales, Eleonore Chiquito, MD       Facility-Administered Medications Ordered in Other Visits  Medication Dose Route Frequency Provider Last Rate Last Admin   mitomycin (MUTAMYCIN) chemo injection 40 mg  40 mg Bladder Instillation Once Heloise Purpura, MD        Allergies as of 08/02/2022   (No Known Allergies)     Family History  Problem Relation Age of Onset   Heart attack Mother    Stomach cancer Father    Cholelithiasis Sister    Cancer Brother        Prostate   Colon cancer Neg Hx    Esophageal cancer Neg Hx    Colon polyps Neg Hx    Rectal cancer Neg Hx     Social History   Socioeconomic History   Marital status: Married    Spouse name: Zella Ball   Number of children: 3   Years of education: Not on file   Highest education level: Not on file  Occupational History   Not on file  Tobacco Use   Smoking status: Former    Packs/day: 0.50    Years: 10.00    Additional pack years: 0.00    Total pack years: 5.00    Types: Cigarettes    Quit date: 06/27/1981    Years since quitting: 41.1   Smokeless tobacco: Never  Vaping Use   Vaping Use: Never used  Substance and Sexual Activity   Alcohol use: Yes    Comment: occasional   Drug use: No   Sexual activity: Yes    Birth control/protection: Post-menopausal, Other-see comments    Comment: 1st intercourse 77 yo-Fewer than 5 partners, husband vasectomy  Other Topics Concern   Not on file  Social History Narrative   Not on file   Social Determinants of Health   Financial Resource Strain: Not on file  Food Insecurity: Not on file  Transportation Needs: Not on file  Physical Activity: Not on file  Stress: Not on file  Social Connections: Not on file  Intimate Partner Violence: Not on file    Review of Systems:  All other review of systems negative except as mentioned in the HPI.  Physical Exam: Vital signs in last 24 hours: Blood Pressure (Abnormal) 156/80   Pulse 72   Temperature (Abnormal) 97.5 F (36.4 C)   Respiration (Abnormal) 8   Height 5\' 7"  (1.702 m)   Weight 158 lb (71.7 kg)   Oxygen Saturation 97%   Body Mass Index 24.75 kg/m  General:   Alert, NAD Lungs:  Clear .   Heart:  Regular rate and rhythm Abdomen:  Soft, nontender and nondistended. Neuro/Psych:  Alert and cooperative. Normal mood and affect.  A and O x 3  Reviewed labs, radiology imaging, old records and pertinent past GI work up  Patient is appropriate for planned procedure(s) and anesthesia in an ambulatory setting   K. Scherry Ran , MD [phone]

## 2022-08-02 NOTE — Patient Instructions (Addendum)
Handout on hemorrhoids, polyps, and diverticulosis given to patient Await pathology results Resume previous diet and continue present medications - recommended to use Benefiber 1 teaspoon three times daily  Use hydrocortisone suppository 25 mg 1 per rectum once a day at bedtime for 7 days -  Hemorrhoidal banding scheduled at 3:30 on 12/05/22 - please call office to reschedule if this time/date doesn't work for you    YOU HAD AN ENDOSCOPIC PROCEDURE TODAY AT THE [facility]:   Refer to the procedure report that was given to you for any specific questions about what was found during the examination.  If the procedure report does not answer your questions, please call your gastroenterologist to clarify.  If you requested that your care partner not be given the details of your procedure findings, then the procedure report has been included in a sealed envelope for you to review at your convenience later.  YOU SHOULD EXPECT: Some feelings of bloating in the abdomen. Passage of more gas than usual.  Walking can help get rid of the air that was put into your GI tract during the procedure and reduce the bloating. If you had a lower endoscopy (such as a colonoscopy or flexible sigmoidoscopy) you may notice spotting of blood in your stool or on the toilet paper. If you underwent a bowel prep for your procedure, you may not have a normal bowel movement for a few days.  Please Note:  You might notice some irritation and congestion in your nose or some drainage.  This is from the oxygen used during your procedure.  There is no need for concern and it should clear up in a day or so.  SYMPTOMS TO REPORT IMMEDIATELY:  Following lower endoscopy (colonoscopy or flexible sigmoidoscopy):  Excessive amounts of blood in the stool  Significant tenderness or worsening of abdominal pains  Swelling of the abdomen that is new, acute  Fever of 100F or higher  For urgent or emergent issues, a gastroenterologist  can be reached at any hour by calling [phone]. Do not use MyChart messaging for urgent concerns.    DIET:  We do recommend a small meal at first, but then you may proceed to your regular diet.  Drink plenty of fluids but you should avoid alcoholic beverages for 24 hours.  ACTIVITY:  You should plan to take it easy for the rest of today and you should NOT DRIVE or use heavy machinery until tomorrow (because of the sedation medicines used during the test).    FOLLOW UP: Our staff will call the number listed on your records the next business day following your procedure.  We will call around 7:15- 8:00 am to check on you and address any questions or concerns that you may have regarding the information given to you following your procedure. If we do not reach you, we will leave a message.     If any biopsies were taken you will be contacted by phone or by letter within the next 1-3 weeks.  Please call us at [phone] if you have not heard about the biopsies in 3 weeks.    SIGNATURES/CONFIDENTIALITY: You and/or your care partner have signed paperwork which will be entered into your electronic medical record.  These signatures attest to the fact that that the information above on your After Visit Summary has been reviewed and is understood.  Full responsibility of the confidentiality of this discharge information lies with you and/or your care-partner.

## 2022-08-02 NOTE — Progress Notes (Signed)
Vss nad trans to pacu 

## 2022-08-02 NOTE — Progress Notes (Signed)
Called to room to assist during endoscopic procedure.  Patient ID and intended procedure confirmed with present staff. Received instructions for my participation in the procedure from the performing physician.  

## 2022-08-02 NOTE — Op Note (Signed)
[facility] Patient Name: Donna [name] Procedure Date: 08/02/2022 2:59 PM MRN: [id number] Endoscopist: Napoleon Form , MD, [id number] Age: 77 Referring MD:  Date of Birth: 02-Aug-1945 Gender: Female Account #: [id number] Procedure:                Colonoscopy Indications:              High risk colon cancer surveillance: Personal                            history of colonic polyps, High risk colon cancer                            surveillance: Personal history of adenoma less than                            10 mm in size Medicines:                Monitored Anesthesia Care Procedure:                Pre-Anesthesia Assessment:                           - Prior to the procedure, a History and Physical                            was performed, and patient medications and                            allergies were reviewed. The patient's tolerance of                            previous anesthesia was also reviewed. The risks                            and benefits of the procedure and the sedation                            options and risks were discussed with the patient.                            All questions were answered, and informed consent                            was obtained. Prior Anticoagulants: The patient has                            taken no anticoagulant or antiplatelet agents. ASA                            Grade Assessment: II - A patient with mild systemic                            disease. After reviewing the risks and benefits,  the patient was deemed in satisfactory condition to                            undergo the procedure.                           After obtaining informed consent, the colonoscope                            was passed under direct vision. Throughout the                            procedure, the patient's blood pressure, pulse, and                            oxygen saturations were monitored  continuously. The                            Olympus [id number]-[id number] (#[id number]) Colonoscope was                            introduced through the anus and advanced to the the                            cecum, identified by appendiceal orifice and                            ileocecal valve. The colonoscopy was performed                            without difficulty. The patient tolerated the                            procedure well. The quality of the bowel                            preparation was good. The ileocecal valve,                            appendiceal orifice, and rectum were photographed. Scope In: 3:26:13 PM Scope Out: 3:57:06 PM Scope Withdrawal Time: 0 hours 20 minutes 11 seconds  Total Procedure Duration: 0 hours 30 minutes 53 seconds  Findings:                 The perianal and digital rectal examinations were                            normal.                           A 4 mm polyp was found in the recto-sigmoid colon.                            The polyp was sessile. The polyp was removed with a  cold snare. Resection and retrieval were complete.                           Two sessile polyps were found in the transverse                            colon and ileocecal valve. The polyps were 1 to 2                            mm in size. These polyps were removed with a cold                            biopsy forceps. Resection and retrieval were                            complete.                           A few small-mouthed diverticula were found in the                            sigmoid colon.                           Non-bleeding internal hemorrhoids were found during                            retroflexion. The hemorrhoids were large. Complications:            No immediate complications. Estimated Blood Loss:     Estimated blood loss was minimal. Impression:               - One 4 mm polyp at the recto-sigmoid colon,                             removed with a cold snare. Resected and retrieved.                           - Two 1 to 2 mm polyps in the transverse colon and                            at the ileocecal valve, removed with a cold biopsy                            forceps. Resected and retrieved.                           - Diverticulosis in the sigmoid colon.                           - Non-bleeding internal hemorrhoids. Recommendation:           - Patient has a contact number available for  emergencies. The signs and symptoms of potential                            delayed complications were discussed with the                            patient. Return to normal activities tomorrow.                            Written discharge instructions were provided to the                            patient.                           - Resume previous diet.                           - Continue present medications.                           - Await pathology results.                           - No repeat colonoscopy due to age.                           - Return to GI clinic as needed for hemorrhoid band                            ligation if has persistent symptoms from                            hemorrhoids.                           - Use Benefiber one teaspoon PO TID.                           - Use hydrocortisone suppository 25 mg 1 per rectum                            once a day at bedtime for 7 days. Napoleon Form, MD 08/02/2022 4:06:45 PM This report has been signed electronically.

## 2022-08-03 ENCOUNTER — Telehealth: Payer: Self-pay | Admitting: *Deleted

## 2022-08-03 NOTE — Telephone Encounter (Signed)
  Follow up Call-     08/02/2022    2:56 PM  Call back number  Post procedure Call Back phone  # [phone]  Permission to leave phone message Yes     Patient questions:  Do you have a fever, pain , or abdominal swelling? No. Pain Score  0 *  Have you tolerated food without any problems? Yes.    Have you been able to return to your normal activities? Yes.    Do you have any questions about your discharge instructions: Diet   No. Medications  No. Follow up visit  No.  Do you have questions or concerns about your Care? No.  Actions: * If pain score is 4 or above: No action needed, pain <4.

## 2022-08-06 DIAGNOSIS — S61301A Unspecified open wound of left index finger with damage to nail, initial encounter: Secondary | ICD-10-CM | POA: Diagnosis not present

## 2022-08-08 ENCOUNTER — Telehealth: Payer: Self-pay

## 2022-08-08 ENCOUNTER — Ambulatory Visit: Payer: Medicare Other

## 2022-08-08 DIAGNOSIS — I491 Atrial premature depolarization: Secondary | ICD-10-CM | POA: Diagnosis not present

## 2022-08-08 NOTE — Telephone Encounter (Signed)
I spoke to the patient over the phone. She will continue to take the cardia XT on 80 mg p.o. daily while she wears the monitor.  Leanthony Rhett [place], DO, [facility]

## 2022-08-08 NOTE — Telephone Encounter (Signed)
When should she restart her Cartia XT 180mg ?

## 2022-08-18 ENCOUNTER — Encounter: Payer: Self-pay | Admitting: Gastroenterology

## 2022-08-18 DIAGNOSIS — I491 Atrial premature depolarization: Secondary | ICD-10-CM | POA: Diagnosis not present

## 2022-08-20 DIAGNOSIS — T7840XA Allergy, unspecified, initial encounter: Secondary | ICD-10-CM | POA: Diagnosis not present

## 2022-08-20 DIAGNOSIS — R21 Rash and other nonspecific skin eruption: Secondary | ICD-10-CM | POA: Diagnosis not present

## 2022-08-20 DIAGNOSIS — L509 Urticaria, unspecified: Secondary | ICD-10-CM | POA: Diagnosis not present

## 2022-09-02 ENCOUNTER — Encounter: Payer: Self-pay | Admitting: Cardiology

## 2022-09-02 ENCOUNTER — Ambulatory Visit: Payer: Medicare Other | Admitting: Cardiology

## 2022-09-02 VITALS — BP 130/81 | HR 70 | Ht 67.0 in | Wt 152.0 lb

## 2022-09-02 DIAGNOSIS — I471 Supraventricular tachycardia, unspecified: Secondary | ICD-10-CM

## 2022-09-02 DIAGNOSIS — E7800 Pure hypercholesterolemia, unspecified: Secondary | ICD-10-CM | POA: Diagnosis not present

## 2022-09-02 DIAGNOSIS — I491 Atrial premature depolarization: Secondary | ICD-10-CM | POA: Diagnosis not present

## 2022-09-02 DIAGNOSIS — Z712 Person consulting for explanation of examination or test findings: Secondary | ICD-10-CM | POA: Diagnosis not present

## 2022-09-02 MED ORDER — DILTIAZEM HCL ER COATED BEADS 180 MG PO CP24
180.0000 mg | ORAL_CAPSULE | Freq: Every day | ORAL | 1 refills | Status: AC
Start: 2022-09-02 — End: 2022-12-01

## 2022-09-02 NOTE — Progress Notes (Signed)
ID:  Donna [name], DOB [birth year], MRN [id number]  PCP:  Gaspar Garbe, MD  Cardiologist:  Tessa Lerner, DO, [facility] (established care 12/28/2021)  Date: 09/02/22 Last Office Visit: 02/22/2022  Chief Complaint  Patient presents with   Follow-up   Premature atrial complex    HPI  Donna [name] is a 77 y.o. Caucasian female whose past medical history and cardiovascular risk factors include: History of bladder cancer 2020 (cystoscopy/BCG treatment), hyperlipidemia, osteopenia, postmenopausal female.  In August 2023 patient was having an episode of URI and was having excessive amount of coughing spells.  She followed up with her PCP and on auscultation was noted to have extra beats.  This prompted an EKG which noted sinus rhythm with question of bigeminy.  At that time she was referred to cardiology.  However, the August 2023 referral just became fruitful in October 2023.  She underwent initial Zio patch which noted a PAC burden of approximately 36.7%.  She was started on calcium channel blockers and at the last office visit a repeat cardiac monitor was recommended prior to today's encounter.  Most recent Zio patch notes underlying rhythm to be sinus but her PAC burden has improved but still elevated at approximately 20.7%.  Clinically she denies anginal chest pain or heart failure symptoms.  No palpitations/near syncope/syncope.  FUNCTIONAL STATUS: Enjoys doing Pilates at least twice a week, goes out for a walk once or twice a week as well.  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  ALLERGIES: No Known Allergies  MEDICATION LIST PRIOR TO VISIT: Current Meds  Medication Sig   cetirizine (ZYRTEC) 10 MG tablet Take 10 mg by mouth every morning.    cholecalciferol (VITAMIN D) 1000 units tablet Take 1,000 Units by mouth daily.   guaiFENesin (MUCUS RELIEF ADULT PO) Take by mouth as needed.   hydrocortisone (ANUSOL-HC) 25 MG suppository Place 1 suppository (25 mg total)  rectally at bedtime.   pantoprazole (PROTONIX) 40 MG tablet Take 1 tablet (40 mg total) by mouth daily.   [DISCONTINUED] CARTIA XT 180 MG 24 hr capsule TAKE 1 CAPSULE(180 MG) BY MOUTH EVERY MORNING     PAST MEDICAL HISTORY: Past Medical History:  Diagnosis Date   Bladder cancer [facility]) urologist-- dr Laverle Patter   first dx in 2000-- papillary non-invasive , s/p multiple TURBT's w/ Mitomyocin C   Hiatal hernia    History of colon polyps    Osteopenia 2020   T score -1.3 FRAX 9% / 1%   Wears contact lenses     PAST SURGICAL HISTORY: Past Surgical History:  Procedure Laterality Date   COLONOSCOPY     CT CARDIAC SCORING  03/28/2017   SCORE= 0   CYSTOSCOPY W/ RETROGRADES  07/04/2011   Procedure: CYSTOSCOPY WITH RETROGRADE PYELOGRAM;  Surgeon: Crecencio Mc, MD;  Location: [facility];  Service: Urology;  Laterality: Bilateral;   CYSTOSCOPY W/ URETERAL STENT PLACEMENT Bilateral 05/27/2013   Procedure: CYSTOSCOPY WITH RETROGRADE PYELOGRAM/URETERAL STENT PLACEMENT LEFT;  Surgeon: Crecencio Mc, MD;  Location: [facility];  Service: Urology;  Laterality: Bilateral;   TRANSURETHRAL RESECTION OF BLADDER TUMOR  07/04/2011   Procedure: TRANSURETHRAL RESECTION OF BLADDER TUMOR (TURBT);  Surgeon: Crecencio Mc, MD;  Location: [facility];  Service: Urology;  Laterality: N/A;   TRANSURETHRAL RESECTION OF BLADDER TUMOR N/A 05/27/2013   Procedure: TRANSURETHRAL RESECTION OF BLADDER TUMOR (TURBT);  Surgeon: Crecencio Mc, MD;  Location: [facility];  Service: Urology;  Laterality: N/A;   TRANSURETHRAL RESECTION OF BLADDER TUMOR  Bilateral 06/05/2017   Procedure: TRANSURETHRAL RESECTION OF BLADDER TUMOR (TURBT)/ CYSTSCOPY/ BILATERAL RETROGADE PYLEOGRAM;  Surgeon: Heloise Purpura, MD;  Location: [facility];  Service: Urology;  Laterality: Bilateral;  GENERAL ANESTHESIA WITH PARALYSIS   TRANSURETHRAL RESECTION OF BLADDER TUMOR WITH MITOMYCIN-C  x2 in 2000;  x2 in 2001;  2002;  2003;  2005;  2006   dr humphries   UPPER GASTROINTESTINAL ENDOSCOPY       FAMILY HISTORY: The patient family history includes Cancer in her brother; Cholelithiasis in her sister; Heart attack in her mother; Stomach cancer in her father.  SOCIAL HISTORY:  The patient  reports that she quit smoking about 41 years ago. Her smoking use included cigarettes. She has a 5.00 pack-year smoking history. She has never used smokeless tobacco. She reports current alcohol use. She reports that she does not use drugs.  REVIEW OF SYSTEMS: Review of Systems  Cardiovascular:  Negative for chest pain, claudication, dyspnea on exertion, irregular heartbeat, leg swelling, near-syncope, orthopnea, palpitations, paroxysmal nocturnal dyspnea and syncope.  Respiratory:  Negative for shortness of breath.   Hematologic/Lymphatic: Negative for bleeding problem.  Musculoskeletal:  Negative for muscle cramps and myalgias.  Neurological:  Negative for dizziness and light-headedness.    PHYSICAL EXAM:    09/02/2022   10:07 AM 08/02/2022    4:20 PM 08/02/2022    4:10 PM  Vitals with BMI  Height 5\' 7"     Weight 152 lbs    BMI 23.8    Systolic 130 118 94  Diastolic 81 78 56  Pulse 70 68 95    Physical Exam  Constitutional: No distress.  Age appropriate, hemodynamically stable.   Neck: No JVD present.  Cardiovascular: Normal rate, regular rhythm, S1 normal, S2 normal, intact distal pulses and normal pulses. Exam reveals no gallop, no S3 and no S4.  No murmur heard. Pulmonary/Chest: Effort normal and breath sounds normal. No stridor. She has no wheezes. She has no rales.  Abdominal: Soft. Bowel sounds are normal. She exhibits no distension. There is no abdominal tenderness.  Musculoskeletal:        General: No edema.     Cervical back: Neck supple.  Neurological: She is alert and oriented to person, place, and time. She has intact cranial nerves (2-12).  Skin: Skin is warm and moist.   CARDIAC DATABASE: EKG: 12/28/2021: NSR, 71bpm, frequent PACs, LAE, consider anteroseptal  infarct, non-specific T wave abnormalities.  September 02, 2022: Sinus rhythm, 71 bpm, atrial bigeminy, LAE, nonspecific T wave abnormality  Echocardiogram: 01/07/2022: Normal LV systolic function with visual EF 60-65%. Left ventricle cavity is normal in size. Normal left ventricular wall thickness. Normal global wall motion. Normal diastolic filling pattern, normal LAP.  Mild tricuspid regurgitation. No evidence of pulmonary hypertension. Mild pulmonic regurgitation. No prior study for comparison.  Stress Testing: No results found for this or any previous visit from the past 1095 days.   Heart Catheterization: None  CAC scoring 03/28/2017 Coronary artery calcium score is 0. Small hiatal hernia. Question a 4 mm nodule in the left lower lobe. No follow-up needed if patient is low-risk. Non-contrast chest CT can be considered in 12 months if patient is high-risk.  Cardiac monitor (Zio Patch): December 28, 2021-December 31, 2021 Dominant rhythm sinus. Heart rate 52-138 bpm. Avg HR 68 bpm. No atrial fibrillation, ventricular tachycardia, high grade AV block, pauses (3 seconds or longer). Total ventricular ectopic burden <1%. Total supraventricular ectopic burden 36.7% (predominantly isolated beats). Patient triggered events: 0.  Cardiac monitor [facility] Patch): August 08, 2022 -August 12, 2022 Dominant rhythm sinus. Heart rate 46-169 bpm.  Avg HR 70 bpm. No atrial fibrillation, high grade AV block, pauses (3 seconds or longer). Total ventricular ectopic burden <1%. 2 asymptomatic episodes of NSVT.  Fastest episode 5 beats, 2 seconds, max HR 158 bpm.  Longest episode was 6 beats, 2.8 seconds, max HR 152 bpm. Total supraventricular ectopic burden 20.7%. Longest episode of PSVT, 48 seconds, max HR 162 bpm. Patient triggered events: 0.   LABORATORY DATA: External Labs: Collected: 03/31/2021. BUN 15, creatinine 0.9. Sodium 141, potassium 4.3, chloride 106, bicarb 20. AST 15, ALT 15, alkaline  phosphatase 74. Hemoglobin 14.4 g/dL, hematocrit 16.1%. Total cholesterol 225, triglycerides 100, HDL 55, LDL 149, non-HDL 169. Apolipoprotein B 103 (upper limit of normal 90)    IMPRESSION:    ICD-10-CM   1. Premature atrial complex  I49.1 EKG 12-Lead    Ambulatory referral to Cardiac Electrophysiology    diltiazem (CARTIA XT) 180 MG 24 hr capsule    2. PSVT (paroxysmal supraventricular tachycardia)  I47.10 Ambulatory referral to Cardiac Electrophysiology    diltiazem (CARTIA XT) 180 MG 24 hr capsule    3. Pure hypercholesterolemia  E78.00     4. Encounter to discuss test results  Z71.2        RECOMMENDATIONS: Donna [name] is a 77 y.o. Caucasian female whose past medical history and cardiac risk factors include: History of bladder cancer 2020 (cystoscopy/BCG treatment), hyperlipidemia, osteopenia, postmenopausal female.  Premature atrial complex PSVT (paroxysmal supraventricular tachycardia) Currently asymptomatic. Prior PVC burden was close to 36.7%. Most recent Zio patch illustrates a PVC burden of 20.7% EKG today shows sinus rhythm with atrial bigeminy. Continue diltiazem 180 mg p.o. daily. We discussed uptitration of medical therapy versus consultation w/ EP given the high burden. Patient states that her husband follows up with Dr. Elberta Fortis I would like to see him in consult. Will arrange a consult for reasons mentioned above.   Pure hypercholesterolemia In the past was noted to have hypercholesterolemia as per her lipids noted above. Currently being followed by PCP.   FINAL MEDICATION LIST END OF ENCOUNTER: Meds ordered this encounter  Medications   diltiazem (CARTIA XT) 180 MG 24 hr capsule    Sig: Take 1 capsule (180 mg total) by mouth daily.    Dispense:  90 capsule    Refill:  1    ZERO refills remain on this prescription. Your patient is requesting advance approval of refills for this medication to PREVENT ANY MISSED DOSES    Medications  Discontinued During This Encounter  Medication Reason   CARTIA XT 180 MG 24 hr capsule Reorder     Current Outpatient Medications:    cetirizine (ZYRTEC) 10 MG tablet, Take 10 mg by mouth every morning. , Disp: , Rfl:    cholecalciferol (VITAMIN D) 1000 units tablet, Take 1,000 Units by mouth daily., Disp: , Rfl:    guaiFENesin (MUCUS RELIEF ADULT PO), Take by mouth as needed., Disp: , Rfl:    hydrocortisone (ANUSOL-HC) 25 MG suppository, Place 1 suppository (25 mg total) rectally at bedtime., Disp: 7 suppository, Rfl: 0   pantoprazole (PROTONIX) 40 MG tablet, Take 1 tablet (40 mg total) by mouth daily., Disp: 90 tablet, Rfl: 3   diltiazem (CARTIA XT) 180 MG 24 hr capsule, Take 1 capsule (180 mg total) by mouth daily., Disp: 90 capsule, Rfl: 1 No current facility-administered medications for this visit.  Facility-Administered Medications Ordered in Other Visits:  mitomycin (MUTAMYCIN) chemo injection 40 mg, 40 mg, Bladder Instillation, Once, Heloise Purpura, MD  Orders Placed This Encounter  Procedures   Ambulatory referral to Cardiac Electrophysiology   EKG 12-Lead   There are no Patient Instructions on file for this visit.   --Continue cardiac medications as reconciled in final medication list. --Return in about 3 months (around 12/03/2022) for Follow up increased PAC burden . or sooner if needed. --Continue follow-up with your primary care physician regarding the management of your other chronic comorbid conditions.  Patient's questions and concerns were addressed to her satisfaction. She voices understanding of the instructions provided during this encounter.   This note was created using a voice recognition software as a result there may be grammatical errors inadvertently enclosed that do not reflect the nature of this encounter. Every attempt is made to correct such errors.  Tessa Lerner, [place], [facility]  Pager:  [phone] Office: [phone]

## 2022-09-07 DIAGNOSIS — S41112A Laceration without foreign body of left upper arm, initial encounter: Secondary | ICD-10-CM | POA: Diagnosis not present

## 2022-10-03 ENCOUNTER — Other Ambulatory Visit: Payer: Self-pay | Admitting: Internal Medicine

## 2022-10-03 DIAGNOSIS — Z1231 Encounter for screening mammogram for malignant neoplasm of breast: Secondary | ICD-10-CM

## 2022-10-27 ENCOUNTER — Other Ambulatory Visit: Payer: Self-pay | Admitting: Gastroenterology

## 2022-11-08 ENCOUNTER — Ambulatory Visit
Admission: RE | Admit: 2022-11-08 | Discharge: 2022-11-08 | Disposition: A | Discharge status: Home / Self Care | Payer: Medicare Other | Source: Ambulatory Visit | Attending: Internal Medicine | Admitting: Internal Medicine

## 2022-11-08 DIAGNOSIS — Z1231 Encounter for screening mammogram for malignant neoplasm of breast: Secondary | ICD-10-CM

## 2022-11-11 ENCOUNTER — Institutional Professional Consult (permissible substitution): Payer: Medicare Other | Admitting: Cardiology

## 2022-11-14 DIAGNOSIS — K080 Exfoliation of teeth due to systemic causes: Secondary | ICD-10-CM | POA: Diagnosis not present

## 2022-12-05 ENCOUNTER — Encounter: Payer: Medicare Other | Admitting: Gastroenterology

## 2022-12-07 ENCOUNTER — Ambulatory Visit: Payer: Medicare Other | Admitting: Cardiology

## 2022-12-07 ENCOUNTER — Other Ambulatory Visit: Payer: Self-pay | Admitting: Cardiology

## 2022-12-07 DIAGNOSIS — I491 Atrial premature depolarization: Secondary | ICD-10-CM

## 2022-12-07 DIAGNOSIS — I471 Supraventricular tachycardia, unspecified: Secondary | ICD-10-CM

## 2022-12-22 DIAGNOSIS — K219 Gastro-esophageal reflux disease without esophagitis: Secondary | ICD-10-CM | POA: Diagnosis not present

## 2022-12-22 DIAGNOSIS — R058 Other specified cough: Secondary | ICD-10-CM | POA: Diagnosis not present

## 2022-12-27 ENCOUNTER — Ambulatory Visit: Payer: Medicare Other | Attending: Cardiology | Admitting: Cardiology

## 2022-12-27 ENCOUNTER — Encounter: Payer: Self-pay | Admitting: Cardiology

## 2022-12-27 VITALS — BP 120/84 | HR 67 | Ht 67.0 in | Wt 158.2 lb

## 2022-12-27 DIAGNOSIS — I471 Supraventricular tachycardia, unspecified: Secondary | ICD-10-CM

## 2022-12-27 DIAGNOSIS — I491 Atrial premature depolarization: Secondary | ICD-10-CM | POA: Diagnosis not present

## 2022-12-27 NOTE — Progress Notes (Signed)
  Electrophysiology Office Note:   Date:  12/27/2022  ID:  Donna [name], DOB [birth year], MRN [id number]  Primary Cardiologist: Rondall Allegra, DO Electrophysiologist: Regan Lemming, MD      History of Present Illness:   Donna [name] is a 77 y.o. female with h/o hyperlipidemia, SVT seen today for  for Electrophysiology evaluation of SVT at the request of Sunit Tolia.    August 2023, she had a URI.  She had an excessive amount of coughing spells.  On PCPs auscultation, she was found to have multiple extra beats.  EKG showed sinus rhythm with possible bigeminy.  She wore a ZIO monitor that showed a 36.7% PAC burden.  She was started on a calcium channel blocker with a repeat monitor showing a 20% burden.  Today she feels well.  She is able to all of her daily activities.  She is completely asymptomatic from her PACs.  She has noted no difference since being on diltiazem.  She does not have palpitations, shortness of breath, fatigue.  Review of systems complete and found to be negative unless listed in HPI.   EP Information / Studies Reviewed:    EKG is ordered today. Personal review as below.  EKG Interpretation Date/Time:  Tuesday December 27 2022 15:47:56 EDT Ventricular Rate:  67 PR Interval:  156 QRS Duration:  74 QT Interval:  386 QTC Calculation: 407 R Axis:   27  Text Interpretation: Sinus rhythm with Premature atrial complexes in a pattern of bigeminy Nonspecific ST and T wave abnormality When compared with ECG of 09-Nov-2018 22:50, No significant change was found Confirmed by Arrielle Mcginn ([id number]) on 12/27/2022 4:09:57 PM     Risk Assessment/Calculations:              Physical Exam:   VS:  BP 120/84 (BP Location: Left Arm, Patient Position: Sitting, Cuff Size: Normal)   Pulse 67   Ht 5\' 7"  (1.702 m)   Wt 158 lb 3.2 oz (71.8 kg)   SpO2 96%   BMI 24.78 kg/m    Wt Readings from Last 3 Encounters:  12/27/22 158 lb 3.2 oz (71.8 kg)  09/02/22 152 lb  (68.9 kg)  08/02/22 158 lb (71.7 kg)     GEN: Well nourished, well developed in no acute distress NECK: No JVD; No carotid bruits CARDIAC: Regular rate and rhythm with occasional ectopy, no murmurs, rubs, gallops RESPIRATORY:  Clear to auscultation without rales, wheezing or rhonchi  ABDOMEN: Soft, non-tender, non-distended EXTREMITIES:  No edema; No deformity   ASSESSMENT AND PLAN:    1.  PACs: Elevated burden at 36%, reduced to 20% on most recent monitor.  She is currently on diltiazem.  She is minimally symptomatic.  2.  SVT: On diltiazem.  She was unaware of her SVT.  Nariya Neumeyer stop diltiazem.  If she continues to be asymptomatic, would continue to monitor.  Follow up with Dr. Elberta Fortis in 6 months  Signed, Nicasio Barlowe Donna Loa, MD

## 2022-12-27 NOTE — Patient Instructions (Signed)
Medication Instructions:  Your physician has recommended you make the following change in your medication:  STOP Diltiazem  *If you need a refill on your cardiac medications before your next appointment, please call your pharmacy*   Lab Work: None ordered  Testing/Procedures: None ordered   Follow-Up: At [facility], you and your health needs are our priority.  As part of our continuing mission to provide you with exceptional heart care, we have created designated Provider Care Teams.  These Care Teams include your primary Cardiologist (physician) and Advanced Practice Providers (APPs -  Physician Assistants and Nurse Practitioners) who all work together to provide you with the care you need, when you need it.  Your next appointment:   6 month(s)  The format for your next appointment:   In Person  Provider:   Loman Brooklyn, MD    Thank you for choosing [facility]!!   Dory Horn, RN [phone]  Other Instructions

## 2023-01-02 ENCOUNTER — Ambulatory Visit: Payer: Medicare Other | Attending: Cardiology | Admitting: Cardiology

## 2023-01-02 VITALS — BP 120/78 | HR 67 | Resp 16 | Ht 67.0 in | Wt 154.0 lb

## 2023-01-02 DIAGNOSIS — E7800 Pure hypercholesterolemia, unspecified: Secondary | ICD-10-CM

## 2023-01-02 DIAGNOSIS — I471 Supraventricular tachycardia, unspecified: Secondary | ICD-10-CM | POA: Diagnosis not present

## 2023-01-02 DIAGNOSIS — I491 Atrial premature depolarization: Secondary | ICD-10-CM

## 2023-01-02 NOTE — Progress Notes (Signed)
Cardiology Office Note:  .   ID:  Donna, [name] Jul 28, 1945, MRN [id number] PCP:  Gaspar Garbe, MD  Former Cardiology Providers: None [facility] Providers Cardiologist:  Tessa Lerner, DO, [facility] (established care 12/28/2021) Electrophysiologist:  Will Jorja Loa, MD  Electrophysiologist:  Will Jorja Loa, MD  Click to update primary MD,subspecialty MD or APP then REFRESH:1}    Chief Complaint  Patient presents with   Follow-up    PAC burden - s/p EP evaluation    History of Present Illness: Marland Kitchen   Donna [name] is a 77 y.o. Caucasian female whose past medical history and cardiovascular risk factors includes: History of bladder cancer 2020 (cystoscopy/BCG treatment), hyperlipidemia, osteopenia, postmenopausal female.   She has known history of elevated PAC burden of approximately 36.7% on prior cardiac monitor, was started on calcium channel blockers. Her PAC burden improved to 20.7%. Due to the increased PAC burden, she was referred to cardiac electrophysiology for further evaluation. She saw Dr. Elberta Fortis in October of 2024 who recommended discontinuation of diltiazem and to monitor clinically.  Since the last office visit, the patient reports feeling great and is asymptomatic. She has been off the medication as per EP recommendations and has not noticed any decrease in endurance or any symptoms such as tiredness or fatigue. She is physically active, attending Pilates twice a week and playing golf once a week.   Review of Systems: .   Review of Systems  Cardiovascular:  Negative for chest pain, claudication, irregular heartbeat, leg swelling, near-syncope, orthopnea, palpitations, paroxysmal nocturnal dyspnea and syncope.  Respiratory:  Negative for shortness of breath.   Hematologic/Lymphatic: Negative for bleeding problem.    Studies Reviewed:   EKG: 12/27/2022 sinus rhythm with PACs and atrial bigeminy pattern, nonspecific ST-T  changes  Echocardiogram: 01/07/2022: Normal LV systolic function with visual EF 60-65%. Left ventricle cavity is normal in size. Normal left ventricular wall thickness. Normal global wall motion. Normal diastolic filling pattern, normal LAP.  Mild tricuspid regurgitation. No evidence of pulmonary hypertension. Mild pulmonic regurgitation. No prior study for comparison.  CAC scoring 03/28/2017 Coronary artery calcium score is 0. See report for additional details   Cardiac monitor (Zio Patch): December 28, 2021-December 31, 2021 Dominant rhythm sinus. Heart rate 52-138 bpm. Avg HR 68 bpm. No atrial fibrillation, ventricular tachycardia, high grade AV block, pauses (3 seconds or longer). Total ventricular ectopic burden <1%. Total supraventricular ectopic burden 36.7% (predominantly isolated beats). Patient triggered events: 0.   Cardiac monitor (Zio Patch): August 08, 2022 -August 12, 2022 Dominant rhythm sinus. Heart rate 46-169 bpm.  Avg HR 70 bpm. No atrial fibrillation, high grade AV block, pauses (3 seconds or longer). Total ventricular ectopic burden <1%. 2 asymptomatic episodes of NSVT.  Fastest episode 5 beats, 2 seconds, max HR 158 bpm.  Longest episode was 6 beats, 2.8 seconds, max HR 152 bpm. Total supraventricular ectopic burden 20.7%. Longest episode of PSVT, 48 seconds, max HR 162 bpm. Patient triggered events: 0.  RADIOLOGY: N/A  Risk Assessment/Calculations:   N/A   Labs:   External Labs: Collected: 03/31/2021. BUN 15, creatinine 0.9. Sodium 141, potassium 4.3, chloride 106, bicarb 20. AST 15, ALT 15, alkaline phosphatase 74. Hemoglobin 14.4 g/dL, hematocrit 66.4%. Total cholesterol 225, triglycerides 100, HDL 55, LDL 149, non-HDL 169. Apolipoprotein B 103 (upper limit of normal 90)  Physical Exam:    Today's Vitals   01/02/23 0925  BP: 120/78  Pulse: 67  Resp: 16  SpO2: 94%  Weight:  154 lb (69.9 kg)  Height: 5\' 7"  (1.702 m)   Body mass index is  24.12 kg/m. Wt Readings from Last 3 Encounters:  01/02/23 154 lb (69.9 kg)  12/27/22 158 lb 3.2 oz (71.8 kg)  09/02/22 152 lb (68.9 kg)    Physical Exam  Constitutional: No distress.  Age appropriate, hemodynamically stable.   Neck: No JVD present.  Cardiovascular: Normal rate, regular rhythm, S1 normal, S2 normal, intact distal pulses and normal pulses. Occasional extrasystoles are present. Exam reveals no gallop, no S3 and no S4.  No murmur heard. Pulmonary/Chest: Effort normal and breath sounds normal. No stridor. She has no wheezes. She has no rales.  Abdominal: Soft. Bowel sounds are normal. She exhibits no distension. There is no abdominal tenderness.  Musculoskeletal:        General: No edema.     Cervical back: Neck supple.  Neurological: She is alert and oriented to person, place, and time. She has intact cranial nerves (2-12).  Skin: Skin is warm and moist.    Impression & Recommendation(s):  Impression:   ICD-10-CM   1. PSVT (paroxysmal supraventricular tachycardia) (HCC)  I47.10     2. Premature atrial complex  I49.1     3. Pure hypercholesterolemia  E78.00        Recommendation(s):  PSVT (paroxysmal supraventricular tachycardia) (HCC) Premature atrial complex Clinically asymptomatic. Prior PAC burden 36.7%. Started on potassium cancel channel blockers and repeat Zio patch illustrates a PAC burden of 20.7% Referred to EP for possible catheter directed ablation versus antiarrhythmic medications. However since she was asymptomatic shared decision between the patient and EP was to discontinue AV nodal blocking agents and to monitor clinically. She remains asymptomatic after discontinuation of Cardizem. Will follow her clinically as recommended  Pure hypercholesterolemia LDL as of January 2023 149 mg/dL Prior coronary calcium score is 0. Patient prefers not to be on lipid-lowering therapy Will defer long-term follow-up and management to PCP at this time.  May  consider repeat coronary calcium score next year to 5-year follow-up study to reevaluate CAC.   As part of today's office visit independently reviewed the progress/consult note from Dr. Elberta Fortis, EKG from 12/27/2022, prior cardiac workup including echocardiogram, CAC report.   Orders Placed:  No orders of the defined types were placed in this encounter.  Final Medication List:   No orders of the defined types were placed in this encounter.   Medications Discontinued During This Encounter  Medication Reason   guaiFENesin (MUCUS RELIEF ADULT PO) Patient Preference   cholecalciferol (VITAMIN D) 1000 units tablet Patient Preference     Current Outpatient Medications:    calcium citrate (CALCITRATE - DOSED IN MG ELEMENTAL CALCIUM) 950 (200 Ca) MG tablet, Take 200 mg of elemental calcium by mouth daily., Disp: , Rfl:    cetirizine (ZYRTEC) 10 MG tablet, Take 10 mg by mouth every morning. , Disp: , Rfl:    hydrocortisone (ANUSOL-HC) 25 MG suppository, Place 1 suppository (25 mg total) rectally at bedtime., Disp: 7 suppository, Rfl: 0   MAGNESIUM CITRATE PO, Take 1 tablet by mouth daily., Disp: , Rfl:    pantoprazole (PROTONIX) 40 MG tablet, TAKE 1 TABLET(40 MG) BY MOUTH DAILY, Disp: 90 tablet, Rfl: 3   VITAMIN D PO, Take 1 tablet by mouth daily., Disp: , Rfl:  No current facility-administered medications for this visit.  Facility-Administered Medications Ordered in Other Visits:    mitomycin (MUTAMYCIN) chemo injection 40 mg, 40 mg, Bladder Instillation, Once, Heloise Purpura, MD  Consent:   NA  Disposition:   1 year follow up after your yearly physical with PCP - bring labs for reference.   Patient may be asked to follow-up sooner based on the results of the above-mentioned testing.  Her questions and concerns were addressed to her satisfaction. She voices understanding of the recommendations provided during this encounter.    Signed, Tessa Lerner, DO, [facility]  [facility]  [address] [place], [place] [address] 01/07/2023 2:04 PM

## 2023-01-02 NOTE — Patient Instructions (Signed)

## 2023-01-07 ENCOUNTER — Encounter: Payer: Self-pay | Admitting: Cardiology

## 2023-01-20 DIAGNOSIS — M1712 Unilateral primary osteoarthritis, left knee: Secondary | ICD-10-CM | POA: Diagnosis not present

## 2023-02-20 DIAGNOSIS — H5203 Hypermetropia, bilateral: Secondary | ICD-10-CM | POA: Diagnosis not present

## 2023-05-08 DIAGNOSIS — L821 Other seborrheic keratosis: Secondary | ICD-10-CM | POA: Diagnosis not present

## 2023-05-08 DIAGNOSIS — D225 Melanocytic nevi of trunk: Secondary | ICD-10-CM | POA: Diagnosis not present

## 2023-05-08 DIAGNOSIS — D2262 Melanocytic nevi of left upper limb, including shoulder: Secondary | ICD-10-CM | POA: Diagnosis not present

## 2023-05-08 DIAGNOSIS — Z85828 Personal history of other malignant neoplasm of skin: Secondary | ICD-10-CM | POA: Diagnosis not present

## 2023-05-17 DIAGNOSIS — E7800 Pure hypercholesterolemia, unspecified: Secondary | ICD-10-CM | POA: Diagnosis not present

## 2023-05-17 DIAGNOSIS — Z1212 Encounter for screening for malignant neoplasm of rectum: Secondary | ICD-10-CM | POA: Diagnosis not present

## 2023-05-17 DIAGNOSIS — E785 Hyperlipidemia, unspecified: Secondary | ICD-10-CM | POA: Diagnosis not present

## 2023-05-17 DIAGNOSIS — E559 Vitamin D deficiency, unspecified: Secondary | ICD-10-CM | POA: Diagnosis not present

## 2023-05-25 DIAGNOSIS — Z1339 Encounter for screening examination for other mental health and behavioral disorders: Secondary | ICD-10-CM | POA: Diagnosis not present

## 2023-05-25 DIAGNOSIS — Z0000 Encounter for general adult medical examination without abnormal findings: Secondary | ICD-10-CM | POA: Diagnosis not present

## 2023-05-25 DIAGNOSIS — Z1331 Encounter for screening for depression: Secondary | ICD-10-CM | POA: Diagnosis not present

## 2023-05-25 DIAGNOSIS — R82998 Other abnormal findings in urine: Secondary | ICD-10-CM | POA: Diagnosis not present

## 2023-05-25 DIAGNOSIS — C679 Malignant neoplasm of bladder, unspecified: Secondary | ICD-10-CM | POA: Diagnosis not present

## 2023-06-19 DIAGNOSIS — M8580 Other specified disorders of bone density and structure, unspecified site: Secondary | ICD-10-CM | POA: Diagnosis not present

## 2023-06-27 DIAGNOSIS — M1712 Unilateral primary osteoarthritis, left knee: Secondary | ICD-10-CM | POA: Diagnosis not present

## 2023-07-03 DIAGNOSIS — C679 Malignant neoplasm of bladder, unspecified: Secondary | ICD-10-CM | POA: Diagnosis not present

## 2023-07-14 DIAGNOSIS — Z8551 Personal history of malignant neoplasm of bladder: Secondary | ICD-10-CM | POA: Diagnosis not present

## 2023-07-14 DIAGNOSIS — R8271 Bacteriuria: Secondary | ICD-10-CM | POA: Diagnosis not present

## 2023-07-18 ENCOUNTER — Encounter: Payer: Self-pay | Admitting: Cardiology

## 2023-07-18 ENCOUNTER — Ambulatory Visit: Attending: Cardiology | Admitting: Cardiology

## 2023-07-18 VITALS — BP 124/76 | HR 73 | Ht 67.0 in | Wt 154.0 lb

## 2023-07-18 DIAGNOSIS — I491 Atrial premature depolarization: Secondary | ICD-10-CM | POA: Diagnosis not present

## 2023-07-18 DIAGNOSIS — I471 Supraventricular tachycardia, unspecified: Secondary | ICD-10-CM | POA: Diagnosis not present

## 2023-07-18 NOTE — Patient Instructions (Signed)
  Follow-Up: At [facility], you and your health needs are our priority.  As part of our continuing mission to provide you with exceptional heart care, our providers are all part of one team.  This team includes your primary Cardiologist (physician) and Advanced Practice Providers or APPs (Physician Assistants and Nurse Practitioners) who all work together to provide you with the care you need, when you need it.  Your next appointment:    As needed

## 2023-07-18 NOTE — Progress Notes (Signed)
  Electrophysiology Office Note:   Date:  07/18/2023  ID:  Donna [name], DOB Aug 08, 1945, MRN [id number]  Primary Cardiologist: Olinda Bertrand, DO Primary Heart Failure: None Electrophysiologist: Langdon Crosson Cortland Ding, MD      History of Present Illness:   Donna [name] is a 78 y.o. female with h/o hyperlipidemia, SVT, PACs seen today for routine electrophysiology followup.   Since last being seen in our clinic the patient reports doing well.  She has no chest pain or shortness of breath.  She is involved her daily activities.  She is unaware of her PACs.  she denies chest pain, palpitations, dyspnea, PND, orthopnea, nausea, vomiting, dizziness, syncope, edema, weight gain, or early satiety.   Review of systems complete and found to be negative unless listed in HPI.   EP Information / Studies Reviewed:    EKG is ordered today. Personal review as below.  EKG Interpretation Date/Time:  Tuesday Jul 18 2023 16:48:13 EDT Ventricular Rate:  73 PR Interval:  168 QRS Duration:  74 QT Interval:  406 QTC Calculation: 447 R Axis:   21  Text Interpretation: Sinus rhythm with Premature atrial complexes in a pattern of bigeminy Nonspecific ST and T wave abnormality When compared with ECG of 27-Dec-2022 15:47, No significant change was found Confirmed by Shakerra Red ([id number]) on 07/18/2023 5:01:58 PM     Risk Assessment/Calculations:             Physical Exam:   VS:  BP 124/76 (BP Location: Right Arm, Patient Position: Sitting, Cuff Size: Normal)   Pulse 73   Ht 5\' 7"  (1.702 m)   Wt 154 lb (69.9 kg)   SpO2 97%   BMI 24.12 kg/m    Wt Readings from Last 3 Encounters:  07/18/23 154 lb (69.9 kg)  01/02/23 154 lb (69.9 kg)  12/27/22 158 lb 3.2 oz (71.8 kg)     GEN: Well nourished, well developed in no acute distress NECK: No JVD; No carotid bruits CARDIAC: Irregularly irregular rate and rhythm, no murmurs, rubs, gallops RESPIRATORY:  Clear to auscultation without rales, wheezing  or rhonchi  ABDOMEN: Soft, non-tender, non-distended EXTREMITIES:  No edema; No deformity   ASSESSMENT AND PLAN:    1.  PACs: Elevated burden at 36%, reduced to 20% on most recent monitor.  Minimally symptomatic.  As she is minimally symptomatic, Waylon Koffler not start any new medications.  We can see her back on a as needed basis.  2.  SVT: Unaware of episodes of SVT.  Follow up with Dr. Lawana Pray as needed   Signed, Leimomi Zervas Cortland Ding, MD

## 2023-07-27 DIAGNOSIS — M1712 Unilateral primary osteoarthritis, left knee: Secondary | ICD-10-CM | POA: Diagnosis not present

## 2023-07-27 DIAGNOSIS — M8580 Other specified disorders of bone density and structure, unspecified site: Secondary | ICD-10-CM | POA: Diagnosis not present

## 2023-08-03 DIAGNOSIS — M1712 Unilateral primary osteoarthritis, left knee: Secondary | ICD-10-CM | POA: Diagnosis not present

## 2023-08-04 DIAGNOSIS — Z8551 Personal history of malignant neoplasm of bladder: Secondary | ICD-10-CM | POA: Diagnosis not present

## 2023-08-04 DIAGNOSIS — R8289 Other abnormal findings on cytological and histological examination of urine: Secondary | ICD-10-CM | POA: Diagnosis not present

## 2023-08-05 ENCOUNTER — Telehealth: Payer: Self-pay

## 2023-08-05 NOTE — Telephone Encounter (Signed)
 Auth Submission: APPROVED Site of care: Site of care: [facility] Payer: [employer] Medicare Medication & CPT/J Code(s) submitted: Prolia (Denosumab) [id number] Route of submission (phone, fax, portal): fax Phone # Fax # [phone] Auth type: Buy/Bill PB Units/visits requested: 60mg  x 2 doses Reference number: [id number] Approval from: 08/04/23 to 08/02/24

## 2023-08-07 ENCOUNTER — Other Ambulatory Visit (HOSPITAL_COMMUNITY): Payer: Self-pay | Admitting: *Deleted

## 2023-08-08 ENCOUNTER — Ambulatory Visit (HOSPITAL_COMMUNITY)
Admission: RE | Admit: 2023-08-08 | Discharge: 2023-08-08 | Disposition: A | Discharge status: Home / Self Care | Source: Ambulatory Visit | Attending: Internal Medicine | Admitting: Internal Medicine

## 2023-08-08 DIAGNOSIS — M810 Age-related osteoporosis without current pathological fracture: Secondary | ICD-10-CM | POA: Insufficient documentation

## 2023-08-08 MED ORDER — DENOSUMAB 60 MG/ML ~~LOC~~ SOSY
PREFILLED_SYRINGE | SUBCUTANEOUS | Status: AC
  Filled 2023-08-08: qty 1

## 2023-08-08 MED ORDER — DENOSUMAB 60 MG/ML ~~LOC~~ SOSY
60.0000 mg | PREFILLED_SYRINGE | Freq: Once | SUBCUTANEOUS | Status: AC
  Administered 2023-08-08: 60 mg via SUBCUTANEOUS

## 2023-08-10 DIAGNOSIS — M1712 Unilateral primary osteoarthritis, left knee: Secondary | ICD-10-CM | POA: Diagnosis not present

## 2023-09-25 ENCOUNTER — Other Ambulatory Visit: Payer: Self-pay | Admitting: Internal Medicine

## 2023-09-25 DIAGNOSIS — Z1231 Encounter for screening mammogram for malignant neoplasm of breast: Secondary | ICD-10-CM

## 2023-10-04 DIAGNOSIS — M1712 Unilateral primary osteoarthritis, left knee: Secondary | ICD-10-CM | POA: Diagnosis not present

## 2023-10-19 ENCOUNTER — Telehealth: Payer: Self-pay | Admitting: Gastroenterology

## 2023-10-19 NOTE — Telephone Encounter (Signed)
 Patient is calling complaining of bloating, going to the bathroom 2-3 times of having a BM in the morning, and wants to discuss her hemorrhoids. Patient is requesting a call back. Please advise.

## 2023-10-19 NOTE — Telephone Encounter (Signed)
 Patient reports constipation and bloating. She feels she has incomplete evacuation and will return to the bathroom several times in the day straining to have her bowel movement. She does see a small amount of blood after multiple trips. She denies any rectal pain, itching or burning. Last colonoscopy showed hemorrhoids. I lost my nerve about getting the hemorrhoids banded and canceled. Advised stool softener, moistened flushable wipes and good water intake. Appointment scheduled.

## 2023-11-01 ENCOUNTER — Ambulatory Visit (INDEPENDENT_AMBULATORY_CARE_PROVIDER_SITE_OTHER): Admitting: Gastroenterology

## 2023-11-01 ENCOUNTER — Encounter: Payer: Self-pay | Admitting: Gastroenterology

## 2023-11-01 VITALS — BP 136/74 | HR 84 | Ht 67.0 in | Wt 158.0 lb

## 2023-11-01 DIAGNOSIS — R150 Incomplete defecation: Secondary | ICD-10-CM | POA: Diagnosis not present

## 2023-11-01 DIAGNOSIS — K581 Irritable bowel syndrome with constipation: Secondary | ICD-10-CM

## 2023-11-01 DIAGNOSIS — K5902 Outlet dysfunction constipation: Secondary | ICD-10-CM

## 2023-11-01 DIAGNOSIS — K641 Second degree hemorrhoids: Secondary | ICD-10-CM

## 2023-11-01 NOTE — Progress Notes (Signed)
 Donna [name]    [id number]    Dec 28, 1945  Primary Care Physician:Tisovec, Charlie ORN, MD  Referring Physician: Vernadine Charlie ORN, MD [address] [place],  [place] [address]   Chief complaint:  Hemorrhoids  Discussed the use of AI scribe software for clinical note transcription with the patient, who gave verbal consent to proceed.  History of Present Illness Donna [name] is a 78 year old female with hemorrhoids who presents with incomplete bowel evacuation.  Bowel evacuation symptoms - Incomplete bowel evacuation with fragmented stool passage, requiring multiple trips to the bathroom to feel completely empty - Typically has bowel movements first thing in the morning, two to three times, but has not had them for the past two mornings - Stool is always formed - Wears a pad to prevent soiling underwear - No fecal incontinence or accidents - No mucus discharge  Hemorrhoidal disease - History of hemorrhoids since childbirth - No current discomfort or pain associated with hemorrhoids  Bowel regimen and dietary habits - Not using stool softeners - Taking fiber supplements in the form of gummies, which cause constipation - Has Benefiber powder at home but not using it regularly - Drinks plenty of water  and iced tea, ensuring adequate hydration  Physical activity - Active lifestyle, performed Pilates in the morning and plans household chores in the afternoon  Allergies - No latex allergy      Outpatient Encounter Medications as of 11/01/2023  Medication Sig   calcium citrate (CALCITRATE - DOSED IN MG ELEMENTAL CALCIUM) 950 (200 Ca) MG tablet Take 200 mg of elemental calcium by mouth daily.   cetirizine (ZYRTEC) 10 MG tablet Take 10 mg by mouth every morning.    denosumab  (PROLIA ) 60 MG/ML SOSY injection Inject 60 mg into the skin every 6 (six) months.   pantoprazole  (PROTONIX ) 40 MG tablet TAKE 1 TABLET(40 MG) BY MOUTH DAILY   VITAMIN D PO  Take 1 tablet by mouth daily.   [DISCONTINUED] alendronate (FOSAMAX) 70 MG tablet Take 70 mg by mouth once a week.   [DISCONTINUED] hydrocortisone  (ANUSOL -HC) 25 MG suppository Place 1 suppository (25 mg total) rectally at bedtime.   [DISCONTINUED] MAGNESIUM CITRATE PO Take 1 tablet by mouth daily.   Facility-Administered Encounter Medications as of 11/01/2023  Medication   mitomycin  (MUTAMYCIN ) chemo injection 40 mg    Allergies as of 11/01/2023   (No Known Allergies)    Past Medical History:  Diagnosis Date   Bladder cancer [facility]) urologist-- dr renda   first dx in 2000-- papillary non-invasive , s/p multiple TURBT's w/ Mitomyocin C   Hiatal hernia    History of colon polyps    Osteopenia 2020   T score -1.3 FRAX 9% / 1%   Wears contact lenses     Past Surgical History:  Procedure Laterality Date   COLONOSCOPY     CT CARDIAC SCORING  03/28/2017   SCORE= 0   CYSTOSCOPY W/ RETROGRADES  07/04/2011   Procedure: CYSTOSCOPY WITH RETROGRADE PYELOGRAM;  Surgeon: Noretta renda, MD;  Location: [facility];  Service: Urology;  Laterality: Bilateral;   CYSTOSCOPY W/ URETERAL STENT PLACEMENT Bilateral 05/27/2013   Procedure: CYSTOSCOPY WITH RETROGRADE PYELOGRAM/URETERAL STENT PLACEMENT LEFT;  Surgeon: Noretta renda, MD;  Location: [facility];  Service: Urology;  Laterality: Bilateral;   TRANSURETHRAL RESECTION OF BLADDER TUMOR  07/04/2011   Procedure: TRANSURETHRAL RESECTION OF BLADDER TUMOR (TURBT);  Surgeon: Noretta renda, MD;  Location: [facility];  Service: Urology;  Laterality: N/A;   TRANSURETHRAL RESECTION OF BLADDER TUMOR N/A 05/27/2013   Procedure: TRANSURETHRAL RESECTION OF BLADDER TUMOR (TURBT);  Surgeon: Noretta Ferrara, MD;  Location: [facility];  Service: Urology;  Laterality: N/A;   TRANSURETHRAL RESECTION OF BLADDER TUMOR Bilateral 06/05/2017   Procedure: TRANSURETHRAL RESECTION OF BLADDER TUMOR (TURBT)/ CYSTSCOPY/ BILATERAL RETROGADE [id number];  Surgeon: Ferrara Glance, MD;  Location: [facility];  Service:  Urology;  Laterality: Bilateral;  GENERAL ANESTHESIA WITH PARALYSIS   TRANSURETHRAL RESECTION OF BLADDER TUMOR WITH MITOMYCIN -C  x2 in 2000;  x2 in 2001;  2002;  2003;  2005;  2006   dr humphries   UPPER GASTROINTESTINAL ENDOSCOPY      Family History  Problem Relation Age of Onset   Heart attack Mother    Stomach cancer Father    Cholelithiasis Sister    Cancer Brother        Prostate   Colon cancer Neg Hx    Esophageal cancer Neg Hx    Colon polyps Neg Hx    Rectal cancer Neg Hx     Social History   Socioeconomic History   Marital status: Married    Spouse name: Grayce   Number of children: 3   Years of education: Not on file   Highest education level: Not on file  Occupational History   Not on file  Tobacco Use   Smoking status: Former    Current packs/day: 0.00    Average packs/day: 0.5 packs/day for 10.0 years (5.0 ttl pk-yrs)    Types: Cigarettes    Start date: 06/28/1971    Quit date: 06/27/1981    Years since quitting: 42.3   Smokeless tobacco: Never  Vaping Use   Vaping status: Never Used  Substance and Sexual Activity   Alcohol  use: Yes    Comment: occasional   Drug use: No   Sexual activity: Yes    Birth control/protection: Post-menopausal, Other-see comments    Comment: 1st intercourse 78 yo-Fewer than 5 partners, husband vasectomy  Other Topics Concern   Not on file  Social History Narrative   Not on file   Social Drivers of Health   Financial Resource Strain: Not on file  Food Insecurity: Not on file  Transportation Needs: Not on file  Physical Activity: Not on file  Stress: Not on file  Social Connections: Unknown (08/20/2022)   [facility]   Social Network    Social Network: Not on file  Intimate Partner Violence: Unknown (08/20/2022)   Received from [facility]   HITS    Physically Hurt: Not on file    Insult or Talk Down To: Not on file    Threaten Physical Harm: Not on file    Scream or Curse: Not on file       Review of systems: All other review of systems negative except as mentioned in the HPI.   Physical Exam: Vitals:   11/01/23 1336  BP: 136/74  Pulse: 84   Body mass index is 24.75 kg/m. Gen:      No acute distress HEENT:  sclera anicteric Ext:    No edema Neuro: alert and oriented x 3 Psych: normal mood and affect  Data Reviewed:  Reviewed labs, radiology imaging, old records and pertinent past GI work up   Assessment & Plan PROCEDURE NOTE: The patient presents with symptomatic grade II  hemorrhoids, requesting rubber band ligation of his/her hemorrhoidal disease.  All risks, benefits and alternative forms of therapy were  described and informed consent was obtained.  In the Left Lateral Decubitus position anoscopic examination revealed grade II-III hemorrhoids in the left lateral, right posterior and right anterior position(s).  The anorectum was pre-medicated with 0.125% nitroglycerin and RectiCare The decision was made to band the left lateral internal hemorrhoid, and the [facility] O'Regan System was used to perform band ligation without complication.  Digital anorectal examination was then performed to assure proper positioning of the band, and to adjust the banded tissue as required.  The patient was discharged home without pain or other issues.  Dietary and behavioral recommendations were given and along with follow-up instructions.      Incomplete evacuation of stool Reports of incomplete evacuation of stool, with bowel movements occurring two to three times in the morning. Stool is formed but evacuation is fragmented. Current use of fiber gummies, which are not preferred due to potential constipating effects. Benefiber powder or tablets recommended for better results. She was advised to increase fiber intake to aid in complete evacuation. - Recommend switching to Benefiber powder or tablets - Instruct to take one tablespoon of Benefiber with every meal, at least twice a  day - Encourage intake of at least eight cups of water  daily - Advise to avoid fiber gummies due to potential constipating effects If persistent-difficulty evacuating secondary to constipation with outlet obstruction, pelvic floor dysfunction, will refer to pelvic floor physical therapy for biofeedback     The patient will return in 4 to 6 weeks for  follow-up and possible additional banding as required. No complications were encountered and the patient tolerated the procedure well.    This visit required >20 minutes of patient care (this includes precharting, chart review, review of results, face-to-face time used for counseling as well as treatment plan and follow-up. The patient was provided an opportunity to ask questions and all were answered. The patient agreed with the plan and demonstrated an understanding of the instructions.  LOIS Wilkie Mcgee , MD    CC: Tisovec, Charlie ORN, MD

## 2023-11-01 NOTE — Patient Instructions (Addendum)
 HEMORRHOID BANDING PROCEDURE    FOLLOW-UP CARE   The procedure you have had should have been relatively painless since the banding of the area involved does not have nerve endings and there is no pain sensation.  The rubber band cuts off the blood supply to the hemorrhoid and the band may fall off as soon as 48 hours after the banding (the band may occasionally be seen in the toilet bowl following a bowel movement). You may notice a temporary feeling of fullness in the rectum which should respond adequately to plain Tylenol  or Motrin.  Following the banding, avoid strenuous exercise that evening and resume full activity the next day.  A sitz bath (soaking in a warm tub) or bidet is soothing, and can be useful for cleansing the area after bowel movements.     To avoid constipation, take two tablespoons of natural wheat bran, natural oat bran, flax, Benefiber or any over the counter fiber supplement and increase your water  intake to 7-8 glasses daily.    Unless you have been prescribed anorectal medication, do not put anything inside your rectum for two weeks: No suppositories, enemas, fingers, etc.  Occasionally, you may have more bleeding than usual after the banding procedure.  This is often from the untreated hemorrhoids rather than the treated one.  Don't be concerned if there is a tablespoon or so of blood.  If there is more blood than this, lie flat with your bottom higher than your head and apply an ice pack to the area. If the bleeding does not stop within a half an hour or if you feel faint, call our office at [phone] or go to the emergency room.  Problems are not common; however, if there is a substantial amount of bleeding, severe pain, chills, fever or difficulty passing urine (very rare) or other problems, you should call us  at [phone] or report to the nearest emergency room.  Do not stay seated continuously for more than 2-3 hours for a day or two after the procedure.   Tighten your buttock muscles 10-15 times every two hours and take 10-15 deep breaths every 1-2 hours.  Do not spend more than a few minutes on the toilet if you cannot empty your bowel; instead re-visit the toilet at a later time.  Take Benefiber 1 tsp twice a day    I appreciate the  opportunity to care for you  Thank You   Kavitha Nandigam , MD

## 2023-11-13 ENCOUNTER — Ambulatory Visit
Admission: RE | Admit: 2023-11-13 | Discharge: 2023-11-13 | Disposition: A | Discharge status: Home / Self Care | Source: Ambulatory Visit | Attending: Internal Medicine | Admitting: Internal Medicine

## 2023-11-13 DIAGNOSIS — Z1231 Encounter for screening mammogram for malignant neoplasm of breast: Secondary | ICD-10-CM | POA: Diagnosis not present

## 2023-12-06 ENCOUNTER — Telehealth: Payer: Self-pay | Admitting: Gastroenterology

## 2023-12-06 MED ORDER — PANTOPRAZOLE SODIUM 40 MG PO TBEC: 40.0000 mg | DELAYED_RELEASE_TABLET | Freq: Every day | ORAL | 3 refills | Status: AC

## 2023-12-06 NOTE — Telephone Encounter (Signed)
 Received a call from patient stating she is in need of pantoprazole  medication refill, please review and advise  Thank you

## 2023-12-06 NOTE — Telephone Encounter (Signed)
 Called patient and verified pharmacy and sent in 90 day supply of Pantoprazole 

## 2023-12-14 DIAGNOSIS — K080 Exfoliation of teeth due to systemic causes: Secondary | ICD-10-CM | POA: Diagnosis not present

## 2024-01-09 DIAGNOSIS — M1712 Unilateral primary osteoarthritis, left knee: Secondary | ICD-10-CM | POA: Diagnosis not present

## 2024-01-16 ENCOUNTER — Ambulatory Visit: Admitting: Gastroenterology

## 2024-01-16 ENCOUNTER — Encounter: Payer: Self-pay | Admitting: Gastroenterology

## 2024-01-16 VITALS — BP 126/80 | HR 52 | Ht 67.0 in | Wt 156.4 lb

## 2024-01-16 DIAGNOSIS — K641 Second degree hemorrhoids: Secondary | ICD-10-CM | POA: Diagnosis not present

## 2024-01-16 NOTE — Patient Instructions (Signed)
 HEMORRHOID BANDING PROCEDURE    FOLLOW-UP CARE   The procedure you have had should have been relatively painless since the banding of the area involved does not have nerve endings and there is no pain sensation.  The rubber band cuts off the blood supply to the hemorrhoid and the band may fall off as soon as 48 hours after the banding (the band may occasionally be seen in the toilet bowl following a bowel movement). You may notice a temporary feeling of fullness in the rectum which should respond adequately to plain Tylenol  or Motrin.  Following the banding, avoid strenuous exercise that evening and resume full activity the next day.  A sitz bath (soaking in a warm tub) or bidet is soothing, and can be useful for cleansing the area after bowel movements.     To avoid constipation, take two tablespoons of natural wheat bran, natural oat bran, flax, Benefiber or any over the counter fiber supplement and increase your water  intake to 7-8 glasses daily.    Unless you have been prescribed anorectal medication, do not put anything inside your rectum for two weeks: No suppositories, enemas, fingers, etc.  Occasionally, you may have more bleeding than usual after the banding procedure.  This is often from the untreated hemorrhoids rather than the treated one.  Don't be concerned if there is a tablespoon or so of blood.  If there is more blood than this, lie flat with your bottom higher than your head and apply an ice pack to the area. If the bleeding does not stop within a half an hour or if you feel faint, call our office at [phone] or go to the emergency room.  Problems are not common; however, if there is a substantial amount of bleeding, severe pain, chills, fever or difficulty passing urine (very rare) or other problems, you should call us  at [phone] or report to the nearest emergency room.  Do not stay seated continuously for more than 2-3 hours for a day or two after the procedure.   Tighten your buttock muscles 10-15 times every two hours and take 10-15 deep breaths every 1-2 hours.  Do not spend more than a few minutes on the toilet if you cannot empty your bowel; instead re-visit the toilet at a later time.   Your next appointment is 03/12/24 at 2:30 pm with Dr. Nandigam for Hemorrhoid Banding #3.   _______________________________________________________  If your blood pressure at your visit was 140/90 or greater, please contact your primary care physician to follow up on this.  _______________________________________________________  If you are age 44 or older, your body mass index should be between 23-30. Your Body mass index is 24.5 kg/m. If this is out of the aforementioned range listed, please consider follow up with your Primary Care Provider.  If you are age 66 or younger, your body mass index should be between 19-25. Your Body mass index is 24.5 kg/m. If this is out of the aformentioned range listed, please consider follow up with your Primary Care Provider.   ________________________________________________________  The  GI providers would like to encourage you to use MYCHART to communicate with providers for non-urgent requests or questions.  Due to long hold times on the telephone, sending your provider a message by [facility] may be a faster and more efficient way to get a response.  Please allow 48 business hours for a response.  Please remember that this is for non-urgent requests.  _______________________________________________________  [facility] is  using a team-based approach to care.  Your team is made up of your doctor and two to three APPS. Our APPS (Nurse Practitioners and Physician Assistants) work with your physician to ensure care continuity for you. They are fully qualified to address your health concerns and develop a treatment plan. They communicate directly with your gastroenterologist to care for you. Seeing the Advanced  [facility] Practitioners on your physician's team can help you by facilitating care more promptly, often allowing for earlier appointments, access to diagnostic testing, procedures, and other specialty referrals.   Thank you for choosing me and [facility].  Dr. Kavitha Nandigam

## 2024-01-16 NOTE — Progress Notes (Signed)
 PROCEDURE NOTE: The patient presents with symptomatic grade II-III  hemorrhoids, requesting rubber band ligation of his/her hemorrhoidal disease.  All risks, benefits and alternative forms of therapy were described and informed consent was obtained.   The anorectum was pre-medicated with recticare The decision was made to band the left lateral internal hemorrhoid, and the [facility] O'Regan System was used to perform band ligation without complication.  Digital anorectal examination was then performed to assure proper positioning of the band, and to adjust the banded tissue as required.  The patient was discharged home without pain or other issues.  Dietary and behavioral recommendations were given and along with follow-up instructions.     The following adjunctive treatments were recommended:  Benefiber 1 tablespoon BID  The patient will return in 4-6 weeks for  follow-up and possible additional banding as required. No complications were encountered and the patient tolerated the procedure well.   LOIS Wilkie Mcgee , MD [phone]

## 2024-01-23 ENCOUNTER — Telehealth: Payer: Self-pay | Admitting: Gastroenterology

## 2024-01-23 NOTE — Telephone Encounter (Signed)
Called patient. No answer. Left message of my call.

## 2024-01-23 NOTE — Telephone Encounter (Signed)
 Inbound call from patient stating she had a banding on 11/11 and she is constipated and her hemorrhoids are acting up. She is requesting a call to discuss recommendations. Please advise.

## 2024-01-25 NOTE — Telephone Encounter (Signed)
 Spoke with the patient. Feeling better today. Bowels are moving. No blood. Stools are firm. She is taking the fiber as directed, 1 tbsp twice daily. She can add Miralax 1/2 to full dose daily and titrate to her response/need. No further questions or concerns.

## 2024-02-22 DIAGNOSIS — H5203 Hypermetropia, bilateral: Secondary | ICD-10-CM | POA: Diagnosis not present

## 2024-03-06 LAB — LAB REPORT - SCANNED: EGFR: 77

## 2024-03-12 ENCOUNTER — Encounter: Admitting: Gastroenterology

## 2024-03-14 ENCOUNTER — Other Ambulatory Visit (HOSPITAL_COMMUNITY): Payer: Self-pay | Admitting: Internal Medicine

## 2024-03-14 ENCOUNTER — Encounter: Payer: Self-pay | Admitting: Gastroenterology

## 2024-03-14 ENCOUNTER — Ambulatory Visit: Admitting: Gastroenterology

## 2024-03-14 VITALS — BP 146/86 | Ht 67.0 in | Wt 160.0 lb

## 2024-03-14 DIAGNOSIS — M810 Age-related osteoporosis without current pathological fracture: Secondary | ICD-10-CM | POA: Insufficient documentation

## 2024-03-14 DIAGNOSIS — M6289 Other specified disorders of muscle: Secondary | ICD-10-CM

## 2024-03-14 DIAGNOSIS — K5902 Outlet dysfunction constipation: Secondary | ICD-10-CM

## 2024-03-14 DIAGNOSIS — K641 Second degree hemorrhoids: Secondary | ICD-10-CM

## 2024-03-14 NOTE — Patient Instructions (Signed)

## 2024-03-14 NOTE — Progress Notes (Signed)
 PROCEDURE NOTE: The patient presents with symptomatic grade II  hemorrhoids, requesting rubber band ligation of his/her hemorrhoidal disease.  All risks, benefits and alternative forms of therapy were described and informed consent was obtained.  In the Left Lateral Decubitus position anoscopic examination revealed grade II hemorrhoids in the right posterior position(s).  The anorectum was pre-medicated with RectiCare The decision was made to band the right posterior internal hemorrhoid, and the [facility] ORegan System was used to perform band ligation without complication.  Digital anorectal examination was then performed to assure proper positioning of the band, and to adjust the banded tissue as required.  The patient was discharged home without pain or other issues.  Dietary and behavioral recommendations were given and along with follow-up instructions.     The following adjunctive treatments were recommended:  Benefiber 1 to 2 tablespoons 3 times daily with meals Referred to pelvic floor physical therapy  The patient will return in 6 to 8 weeks for  follow-up and possible additional banding as required. No complications were encountered and the patient tolerated the procedure well.   LOIS Wilkie Mcgee , MD [phone]

## 2024-03-14 NOTE — Progress Notes (Signed)
 Received Prolia  referral. Last dose 08/08/23 and was due Dec 2025  CMP on 03/06/2024 - normal calcium  Sherry Pennant, PharmD, MPH, BCPS, CPP Clinical Pharmacist

## 2024-03-18 ENCOUNTER — Telehealth: Payer: Self-pay | Admitting: Gastroenterology

## 2024-03-18 NOTE — Telephone Encounter (Signed)
 Inbound call from patient stating she had her hemorrhoid banding done and has been extremely sore and would like to speak to nurse and be advised on what to do so that she can feel better  Requesting a call back  Please advise  Thank you

## 2024-03-18 NOTE — Telephone Encounter (Signed)
 Banding 03/14/24.As long As long she sitting she is okay. She tells me she remembers similar symptoms after a previous banding. No bleeding from the rectum. No pain with passage of the bowel movement. She is passing soft bowel movements. Monitor symptoms. Call back if she acutely worsens or fails to improve.

## 2024-03-21 ENCOUNTER — Ambulatory Visit (HOSPITAL_COMMUNITY)
Admission: RE | Admit: 2024-03-21 | Discharge: 2024-03-21 | Disposition: A | Discharge status: Home / Self Care | Source: Ambulatory Visit | Attending: Internal Medicine | Admitting: Internal Medicine

## 2024-03-21 VITALS — BP 152/85 | HR 74 | Temp 97.5°F | Resp 16

## 2024-03-21 DIAGNOSIS — M810 Age-related osteoporosis without current pathological fracture: Secondary | ICD-10-CM | POA: Diagnosis present

## 2024-03-21 MED ORDER — DENOSUMAB 60 MG/ML ~~LOC~~ SOSY
60.0000 mg | PREFILLED_SYRINGE | Freq: Once | SUBCUTANEOUS | Status: AC
  Administered 2024-03-21: 60 mg via SUBCUTANEOUS

## 2024-03-21 MED ORDER — DENOSUMAB 60 MG/ML ~~LOC~~ SOSY
PREFILLED_SYRINGE | SUBCUTANEOUS | Status: AC
  Filled 2024-03-21: qty 1

## 2024-03-27 ENCOUNTER — Other Ambulatory Visit (HOSPITAL_COMMUNITY): Payer: Self-pay | Admitting: Internal Medicine

## 2024-03-27 NOTE — Progress Notes (Signed)
 Error

## 2024-05-06 ENCOUNTER — Ambulatory Visit: Admitting: Physical Therapy

## 2024-05-16 ENCOUNTER — Ambulatory Visit: Admitting: Gastroenterology
